# Patient Record
Sex: Male | Born: 1960 | Race: Black or African American | Hispanic: No | Marital: Married | Smoking: Never smoker
Health system: Southern US, Community
[De-identification: ages and names within clinical notes are randomized; demographics above are authoritative.]

## PROBLEM LIST (undated history)

## (undated) DIAGNOSIS — E119 Type 2 diabetes mellitus without complications: Secondary | ICD-10-CM

## (undated) DIAGNOSIS — I639 Cerebral infarction, unspecified: Secondary | ICD-10-CM

## (undated) DIAGNOSIS — E785 Hyperlipidemia, unspecified: Secondary | ICD-10-CM

## (undated) HISTORY — PX: BRAIN SURGERY: SHX531

---

## 2018-11-21 ENCOUNTER — Other Ambulatory Visit: Payer: Self-pay

## 2018-11-21 ENCOUNTER — Encounter (HOSPITAL_BASED_OUTPATIENT_CLINIC_OR_DEPARTMENT_OTHER): Payer: Self-pay | Admitting: *Deleted

## 2018-11-21 ENCOUNTER — Emergency Department (HOSPITAL_BASED_OUTPATIENT_CLINIC_OR_DEPARTMENT_OTHER): Payer: No Typology Code available for payment source

## 2018-11-21 ENCOUNTER — Emergency Department (HOSPITAL_BASED_OUTPATIENT_CLINIC_OR_DEPARTMENT_OTHER)
Admission: EM | Admit: 2018-11-21 | Discharge: 2018-11-21 | Disposition: A | Discharge status: Home / Self Care | Payer: No Typology Code available for payment source | Attending: Emergency Medicine | Admitting: Emergency Medicine

## 2018-11-21 DIAGNOSIS — Y929 Unspecified place or not applicable: Secondary | ICD-10-CM | POA: Diagnosis not present

## 2018-11-21 DIAGNOSIS — S098XXA Other specified injuries of head, initial encounter: Secondary | ICD-10-CM | POA: Insufficient documentation

## 2018-11-21 DIAGNOSIS — R112 Nausea with vomiting, unspecified: Secondary | ICD-10-CM | POA: Insufficient documentation

## 2018-11-21 DIAGNOSIS — Z87891 Personal history of nicotine dependence: Secondary | ICD-10-CM | POA: Diagnosis not present

## 2018-11-21 DIAGNOSIS — E119 Type 2 diabetes mellitus without complications: Secondary | ICD-10-CM | POA: Diagnosis not present

## 2018-11-21 DIAGNOSIS — Y9389 Activity, other specified: Secondary | ICD-10-CM | POA: Diagnosis not present

## 2018-11-21 DIAGNOSIS — Y999 Unspecified external cause status: Secondary | ICD-10-CM | POA: Insufficient documentation

## 2018-11-21 DIAGNOSIS — W01198A Fall on same level from slipping, tripping and stumbling with subsequent striking against other object, initial encounter: Secondary | ICD-10-CM | POA: Insufficient documentation

## 2018-11-21 DIAGNOSIS — S0990XA Unspecified injury of head, initial encounter: Secondary | ICD-10-CM

## 2018-11-21 HISTORY — DX: Hyperlipidemia, unspecified: E78.5

## 2018-11-21 HISTORY — DX: Cerebral infarction, unspecified: I63.9

## 2018-11-21 HISTORY — DX: Type 2 diabetes mellitus without complications: E11.9

## 2018-11-21 MED ORDER — ACETAMINOPHEN 500 MG PO TABS
1000.0000 mg | ORAL_TABLET | Freq: Once | ORAL | Status: AC
  Administered 2018-11-21: 16:00:00 1000 mg via ORAL
  Filled 2018-11-21: qty 2

## 2018-11-21 NOTE — ED Triage Notes (Signed)
C/o  head jury x 1 week ago , c/o h/a x 1 day , pt is on blood thinners

## 2018-11-21 NOTE — Discharge Instructions (Signed)
It was my pleasure taking care of you today!   Call your neurologist to schedule a follow up appointment.   Return to ER for new or worsening symptoms, any additional concerns.

## 2018-11-21 NOTE — ED Provider Notes (Signed)
[facility] EMERGENCY DEPARTMENT Provider Note   CSN: [id number] Arrival date & time: 11/21/18  1512     History   Chief Complaint Chief Complaint  Patient presents with  . Head Injury    HPI Lars MassonDallas Flavell is a 58 y.o. male.     The history is provided by the patient and medical records. No language interpreter was used.  Head Injury Associated symptoms: headache, nausea and vomiting    Lars MassonDallas Tumminello is a 58 y.o. male  with a PMH as listed below including previous stroke who presents to the Emergency Department complaining of headache.  Patient states that 1 week ago, he was getting out of his car when he lost his balance and fell backwards, striking the back of his head.  There was no loss of consciousness.  He did not think much of it at the time.  The next day, he developed headache consistent with his typical chronic migraine.  He did have 2 episodes of emesis associated with nausea during this.  He took Tylenol which helped improve his symptoms.  He felt well for the next several days.  This morning, he woke up with a right-sided headache once again.  Denies any new numbness or weakness. Currently feels much improved.  Denies vision changes or dizziness.   Past Medical History:  Diagnosis Date  . Diabetes mellitus without complication (HCC)   . Hyperlipemia   . Stroke [facility](HCC)     There are no active problems to display for this patient.   History reviewed. No pertinent surgical history.      Home Medications    Prior to Admission medications   Not on File    Family History No family history on file.  Social History Social History   Tobacco Use  . Smoking status: Former Smoker  Substance Use Topics  . Alcohol use: Not Currently  . Drug use: Not Currently     Allergies   Patient has no known allergies.   Review of Systems Review of Systems  Gastrointestinal: Positive for nausea and vomiting. Negative for abdominal pain, constipation and  diarrhea.  Neurological: Positive for headaches.  All other systems reviewed and are negative.    Physical Exam Updated Vital Signs BP 117/76   Pulse 66   Temp 99.2 F (37.3 C)   Resp 16   Ht 6' (1.829 m)   Wt 86.2 kg   SpO2 100%   BMI 25.77 kg/m   Physical Exam Vitals signs and nursing note reviewed.  Constitutional:      General: He is not in acute distress.    Appearance: He is well-developed.  HENT:     Head: Normocephalic and atraumatic.  Neck:     Musculoskeletal: Neck supple.  Cardiovascular:     Rate and Rhythm: Normal rate and regular rhythm.     Heart sounds: Normal heart sounds. No murmur.  Pulmonary:     Effort: Pulmonary effort is normal. No respiratory distress.     Breath sounds: Normal breath sounds.  Abdominal:     General: There is no distension.     Palpations: Abdomen is soft.     Tenderness: There is no abdominal tenderness.  Skin:    General: Skin is warm and dry.  Neurological:     Mental Status: He is alert and oriented to person, place, and time.     Comments: Left-sided upper and lower extremity weakness which is baseline for patient. CN 2-12 grossly intact.  ED Treatments / Results  Labs (all labs ordered are listed, but only abnormal results are displayed) Labs Reviewed - No data to display  EKG None  Radiology Ct Head Wo Contrast  Result Date: 11/21/2018 CLINICAL DATA:  Head injury 1 week ago. Generalized headache for 1 day. Patient is on blood thinners. History of stroke. EXAM: CT HEAD WITHOUT CONTRAST TECHNIQUE: Contiguous axial images were obtained from the base of the skull through the vertex without intravenous contrast. COMPARISON:  None. FINDINGS: Brain: Encephalomalacia noted throughout the entire right middle cerebral artery territory consistent with an extensive old MCA distribution infarct. There is ex vacuo dilation of the right lateral ventricle. Remaining ventricles are normal size and configuration. No  parenchymal or extra-axial masses. No mass effect. There is no evidence an infarct and no intracranial hemorrhage. Vascular: No hyperdense vessel or unexpected calcification. Skull: Changes from a previous right frontal, parietal, temporal craniotomy. No acute fracture. No bone lesion. Sinuses/Orbits: Globes and orbits are unremarkable. Visualized sinuses and mastoid air cells are clear. Other: None. IMPRESSION: 1. No acute intracranial abnormalities. 2. 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 Status post right-sided craniotomy. Electronically Signed   By: Lajean Manes M.D.   On: 11/21/2018 15:38    Procedures Procedures (including critical care time)  Medications Ordered in ED Medications  acetaminophen (TYLENOL) tablet 1,000 mg (1,000 mg Oral Given 11/21/18 1559)     Initial Impression / Assessment and Plan / ED Course  I have reviewed the triage vital signs and the nursing notes.  Pertinent labs & imaging results that were available during my care of the patient were reviewed by me and considered in my medical decision making (see chart for details).       Rozell Theiler is a 58 y.o. male who presents to ED for evaluation for head injury and headache after fall 1 week ago.  He is on anticoagulation.  Baseline left-sided deficits, but no acute neurology findings on exam.  CT without acute findings.  Tylenol has been controlling his headaches. Evaluation does not show pathology that would require ongoing emergent intervention or inpatient treatment.  He has a neurologist and I encouraged him to call today or tomorrow to schedule follow-up appointment.  Discussed reasons to return to the emergency department.  All questions answered.  Patient seen by and discussed with Dr. Ralene Bathe who agrees with treatment plan.    Final Clinical Impressions(s) / ED Diagnoses   Final diagnoses:  Minor head injury, initial  encounter    ED Discharge Orders    None       Panayiota Larkin, Ozella Almond, PA-C 11/21/18 1657    Quintella Reichert, MD 11/21/18 2357

## 2020-02-06 ENCOUNTER — Emergency Department (HOSPITAL_COMMUNITY): Payer: No Typology Code available for payment source

## 2020-02-06 ENCOUNTER — Encounter (HOSPITAL_COMMUNITY): Payer: Self-pay

## 2020-02-06 ENCOUNTER — Other Ambulatory Visit: Payer: Self-pay

## 2020-02-06 ENCOUNTER — Emergency Department (HOSPITAL_COMMUNITY)
Admission: EM | Admit: 2020-02-06 | Discharge: 2020-02-06 | Disposition: A | Discharge status: Home / Self Care | Payer: No Typology Code available for payment source | Attending: Emergency Medicine | Admitting: Emergency Medicine

## 2020-02-06 DIAGNOSIS — R2242 Localized swelling, mass and lump, left lower limb: Secondary | ICD-10-CM | POA: Diagnosis present

## 2020-02-06 DIAGNOSIS — M7989 Other specified soft tissue disorders: Secondary | ICD-10-CM | POA: Insufficient documentation

## 2020-02-06 DIAGNOSIS — Z7984 Long term (current) use of oral hypoglycemic drugs: Secondary | ICD-10-CM | POA: Insufficient documentation

## 2020-02-06 DIAGNOSIS — E119 Type 2 diabetes mellitus without complications: Secondary | ICD-10-CM | POA: Insufficient documentation

## 2020-02-06 DIAGNOSIS — Z7901 Long term (current) use of anticoagulants: Secondary | ICD-10-CM | POA: Diagnosis not present

## 2020-02-06 LAB — CBC WITH DIFFERENTIAL/PLATELET
Abs Immature Granulocytes: 0.02 10*3/uL (ref 0.00–0.07)
Basophils Absolute: 0 10*3/uL (ref 0.0–0.1)
Basophils Relative: 1 %
Eosinophils Absolute: 0.3 10*3/uL (ref 0.0–0.5)
Eosinophils Relative: 5 %
HCT: 48.6 % (ref 39.0–52.0)
Hemoglobin: 15.1 g/dL (ref 13.0–17.0)
Immature Granulocytes: 0 %
Lymphocytes Relative: 36 %
Lymphs Abs: 2.3 10*3/uL (ref 0.7–4.0)
MCH: 30 pg (ref 26.0–34.0)
MCHC: 31.1 g/dL (ref 30.0–36.0)
MCV: 96.4 fL (ref 80.0–100.0)
Monocytes Absolute: 0.6 10*3/uL (ref 0.1–1.0)
Monocytes Relative: 10 %
Neutro Abs: 3.1 10*3/uL (ref 1.7–7.7)
Neutrophils Relative %: 48 %
Platelets: 200 10*3/uL (ref 150–400)
RBC: 5.04 MIL/uL (ref 4.22–5.81)
RDW: 14.7 % (ref 11.5–15.5)
WBC: 6.4 10*3/uL (ref 4.0–10.5)
nRBC: 0 % (ref 0.0–0.2)

## 2020-02-06 LAB — COMPREHENSIVE METABOLIC PANEL
ALT: 26 U/L (ref 0–44)
AST: 24 U/L (ref 15–41)
Albumin: 4.3 g/dL (ref 3.5–5.0)
Alkaline Phosphatase: 96 U/L (ref 38–126)
Anion gap: 11 (ref 5–15)
BUN: 22 mg/dL — ABNORMAL HIGH (ref 6–20)
CO2: 18 mmol/L — ABNORMAL LOW (ref 22–32)
Calcium: 9.2 mg/dL (ref 8.9–10.3)
Chloride: 111 mmol/L (ref 98–111)
Creatinine, Ser: 1.33 mg/dL — ABNORMAL HIGH (ref 0.61–1.24)
GFR, Estimated: >60 mL/min (ref >60)
Glucose, Bld: 101 mg/dL — ABNORMAL HIGH (ref 70–99)
Potassium: 3.8 mmol/L (ref 3.5–5.1)
Sodium: 140 mmol/L (ref 135–145)
Total Bilirubin: 0.7 mg/dL (ref 0.3–1.2)
Total Protein: 9.3 g/dL — ABNORMAL HIGH (ref 6.5–8.1)

## 2020-02-06 MED ORDER — ENOXAPARIN SODIUM 100 MG/ML ~~LOC~~ SOLN
1.0000 mg/kg | Freq: Once | SUBCUTANEOUS | Status: AC
  Administered 2020-02-06: 95 mg via SUBCUTANEOUS
  Filled 2020-02-06: qty 0.95

## 2020-02-06 MED ORDER — IOHEXOL 350 MG/ML SOLN
100.0000 mL | Freq: Once | INTRAVENOUS | Status: AC | PRN
  Administered 2020-02-06: 100 mL via INTRAVENOUS

## 2020-02-06 NOTE — Discharge Instructions (Addendum)
Return tomorrow for an ultrasound for DVT.

## 2020-02-06 NOTE — ED Provider Notes (Signed)
[facility]-EMERGENCY DEPT Provider Note   CSN: [id number] Arrival date & time: 02/06/20  1304     History Chief Complaint  Patient presents with  . Leg Swelling  . Leg Pain    Eddie Conway is a 59 y.o. male.  Pt presents to the ED today with LLE swelling.  The pt has a hx of CVA with left sided deficits.  He has noticed some swelling for the past 1 to 1.5 weeks.  Pt has a hx of stroke and is on Xarelto.  He has not skipped any doses.  He has been having some sob as well.        Past Medical History:  Diagnosis Date  . Diabetes mellitus without complication (HCC)   . Hyperlipemia   . Stroke [facility])     There are no problems to display for this patient.   Past Surgical History:  Procedure Laterality Date  . BRAIN SURGERY         Family History  Problem Relation Age of Onset  . Diabetes Mother   . Diabetes Father   . Heart failure Father   . Hypertension Father     Social History   Tobacco Use  . Smoking status: Never Smoker  . Smokeless tobacco: Never Used  Vaping Use  . Vaping Use: Never used  Substance Use Topics  . Alcohol use: Not Currently  . Drug use: Not Currently    Home Medications Prior to Admission medications   Medication Sig Start Date End Date Taking? Authorizing Provider  acetaminophen (TYLENOL) 500 MG tablet Take 1,000 mg by mouth every 6 (six) hours as needed for moderate pain.   Yes [provider]  atorvastatin (LIPITOR) 40 MG tablet Take 40 mg by mouth daily.   Yes [provider]  cholecalciferol (VITAMIN D3) 25 MCG (1000 UNIT) tablet Take 1,000 Units by mouth daily.   Yes [provider]  ferrous sulfate 325 (65 FE) MG tablet Take 325 mg by mouth daily with breakfast.   Yes [provider]  levETIRAcetam (KEPPRA) 750 MG tablet Take 750 mg by mouth 2 (two) times daily.   Yes [provider]  magnesium oxide (MAG-OX) 400 MG tablet Take 400 mg by mouth daily.   Yes  [provider]  metFORMIN (GLUCOPHAGE) 500 MG tablet Take 500 mg by mouth 2 (two) times daily with a meal.   Yes [provider]  potassium chloride 20 MEQ/15ML (10%) SOLN Take 20 mEq by mouth See admin instructions. Take 15 ml by mouth 3 times a week ( Monday, Wednesday and Friday , must dilute as instructed)   Yes [provider]  rivaroxaban (XARELTO) 20 MG TABS tablet Take 20 mg by mouth daily with supper.   Yes [provider]  topiramate (TOPAMAX) 100 MG tablet Take 100 mg by mouth in the morning, at noon, and at bedtime.   Yes [provider]  vitamin B-12 (CYANOCOBALAMIN) 500 MCG tablet Take 1,000 mcg by mouth daily.   Yes [provider]    Allergies    Patient has no known allergies.  Review of Systems   Review of Systems  Respiratory: Positive for shortness of breath.   Musculoskeletal:       LLE swelling  All other systems reviewed and are negative.   Physical Exam Updated Vital Signs BP (!) 190/109   Pulse (!) 45   Temp 98.2 F (36.8 C) (Oral)   Resp 15  Ht 6' (1.829 m)   Wt 96.2 kg   SpO2 99%   BMI 28.75 kg/m   Physical Exam Vitals and nursing note reviewed.  Constitutional:      Appearance: Normal appearance.  HENT:     Head: Normocephalic and atraumatic.     Comments: Right sided craniotomy noted    Right Ear: External ear normal.     Left Ear: External ear normal.     Nose: Nose normal.     Mouth/Throat:     Mouth: Mucous membranes are moist.     Pharynx: Oropharynx is clear.  Eyes:     Extraocular Movements: Extraocular movements intact.     Conjunctiva/sclera: Conjunctivae normal.     Pupils: Pupils are equal, round, and reactive to light.  Cardiovascular:     Rate and Rhythm: Normal rate and regular rhythm.     Pulses: Normal pulses.     Heart sounds: Normal heart sounds.  Pulmonary:     Effort: Pulmonary effort is normal.     Breath sounds: Normal breath sounds.  Abdominal:      General: Abdomen is flat. Bowel sounds are normal.     Palpations: Abdomen is soft.  Musculoskeletal:     Cervical back: Normal range of motion and neck supple.     Left lower leg: Edema present.  Skin:    General: Skin is warm.     Capillary Refill: Capillary refill takes less than 2 seconds.  Neurological:     Mental Status: He is alert. Mental status is at baseline.     Comments: LLE and LUE weakness from prior stroke  Psychiatric:        Mood and Affect: Mood normal.        Behavior: Behavior normal.     ED Results / Procedures / Treatments   Labs (all labs ordered are listed, but only abnormal results are displayed) Labs Reviewed  COMPREHENSIVE METABOLIC PANEL - Abnormal; Notable for the following components:      Result Value   CO2 18 (*)    Glucose, Bld 101 (*)    BUN 22 (*)    Creatinine, Ser 1.33 (*)    Total Protein 9.3 (*)    All other components within normal limits  CBC WITH DIFFERENTIAL/PLATELET  URINALYSIS, ROUTINE W REFLEX MICROSCOPIC    EKG EKG Interpretation  Date/Time:  Friday February 06 2020 20:36:53 EST Ventricular Rate:  50 PR Interval:    QRS Duration: 81 QT Interval:  472 QTC Calculation: 431 R Axis:   160 Text Interpretation: Right and left arm electrode reversal, interpretation assumes no reversal Sinus rhythm Prolonged PR interval Lateral infarct, age indeterminate Abnrm T, consider ischemia, anterolateral lds No old tracing to compare Confirmed by Jacalyn Lefevre [phone]) on 02/06/2020 9:34:35 PM   Radiology CT Angio Chest PE W and/or Wo Contrast  Result Date: 02/06/2020 CLINICAL DATA:  Shortness of breath and leg swelling EXAM: CT ANGIOGRAPHY CHEST WITH CONTRAST TECHNIQUE: Multidetector CT imaging of the chest was performed using the standard protocol during bolus administration of intravenous contrast. Multiplanar CT image reconstructions and MIPs were obtained to evaluate the vascular anatomy. CONTRAST:  OMNIPAQUE IOHEXOL 350 MG/ML  SOLN COMPARISON:  None. FINDINGS: Cardiovascular: There is a optimal opacification of the pulmonary arteries. There is no central,segmental, or subsegmental filling defects within the pulmonary arteries. The heart is normal in size. No pericardial effusion or thickening. No evidence right heart strain. There is normal three-vessel brachiocephalic anatomy without proximal  stenosis. Scattered aortic atherosclerotic calcifications are seen without aneurysmal dilatation. Mediastinum/Nodes: No hilar, mediastinal, or axillary adenopathy. Thyroid gland, trachea, and esophagus demonstrate no significant findings. Lungs/Pleura: The lungs are clear. No pleural effusion or pneumothorax. No airspace consolidation. Upper Abdomen: No acute abnormalities present in the visualized portions of the upper abdomen. Musculoskeletal: No chest wall abnormality. No acute or significant osseous findings. Review of the MIP images confirms the above findings. IMPRESSION: No central, segmental, or subsegmental pulmonary embolism. No acute intrathoracic pathology to explain the patient's symptoms. Aortic Atherosclerosis (ICD10-I70.0). Electronically Signed   By: Jonna Clark M.D.   On: 02/06/2020 21:52    Procedures Procedures (including critical care time)  Medications Ordered in ED Medications  enoxaparin (LOVENOX) injection 95 mg (has no administration in time range)  iohexol (OMNIPAQUE) 350 MG/ML injection 100 mL (100 mLs Intravenous Contrast Given 02/06/20 2121)    ED Course  I have reviewed the triage vital signs and the nursing notes.  Pertinent labs & imaging results that were available during my care of the patient were reviewed by me and considered in my medical decision making (see chart for details).    MDM Rules/Calculators/A&P                          Fortunately, pt does not have a PE.  We don't have Vascular US now.  I gave him a dose of lovenox in the ED and referred him for an [place] to be done tomorrow  morning.   Pt's bp is elevated.  He said he does not have a hx of htn.  He follows frequently with his pcp and just saw him a few months ago.  He thinks it is elevated as he is here.  His Cr is slightly elevated.  He's never been told that before.  He is to f/u with his doctor for f/u with his bp and kidney function.  He is given a print out of labs as his pcp is at the [place].  Pt is stable for d/c.  Return if worse.   Final Clinical Impression(s) / ED Diagnoses Final diagnoses:  Localized swelling of left lower leg    Rx / DC Orders ED Discharge Orders         Ordered    LE VENOUS        02/06/20 2213           Jacalyn Lefevre, MD 02/06/20 2221

## 2020-02-06 NOTE — ED Triage Notes (Signed)
Per EMS- Patient c/o left leg swelling and pain x 1 1/2 weeks.  Patient has a history of CVA with left sided deficits.

## 2020-02-07 ENCOUNTER — Emergency Department (HOSPITAL_COMMUNITY)
Admission: EM | Admit: 2020-02-07 | Discharge: 2020-02-08 | Disposition: A | Discharge status: Home / Self Care | Payer: No Typology Code available for payment source | Attending: Emergency Medicine | Admitting: Emergency Medicine

## 2020-02-07 ENCOUNTER — Other Ambulatory Visit: Payer: Self-pay

## 2020-02-07 ENCOUNTER — Ambulatory Visit (HOSPITAL_COMMUNITY)
Admission: RE | Admit: 2020-02-07 | Discharge: 2020-02-07 | Disposition: A | Discharge status: Home / Self Care | Payer: Medicare PPO | Source: Ambulatory Visit | Attending: Emergency Medicine | Admitting: Emergency Medicine

## 2020-02-07 ENCOUNTER — Encounter (HOSPITAL_COMMUNITY): Payer: Self-pay

## 2020-02-07 DIAGNOSIS — Z8673 Personal history of transient ischemic attack (TIA), and cerebral infarction without residual deficits: Secondary | ICD-10-CM | POA: Insufficient documentation

## 2020-02-07 DIAGNOSIS — M79609 Pain in unspecified limb: Secondary | ICD-10-CM

## 2020-02-07 DIAGNOSIS — R2242 Localized swelling, mass and lump, left lower limb: Secondary | ICD-10-CM | POA: Diagnosis present

## 2020-02-07 DIAGNOSIS — M7989 Other specified soft tissue disorders: Secondary | ICD-10-CM | POA: Insufficient documentation

## 2020-02-07 DIAGNOSIS — E119 Type 2 diabetes mellitus without complications: Secondary | ICD-10-CM | POA: Diagnosis not present

## 2020-02-07 DIAGNOSIS — I82412 Acute embolism and thrombosis of left femoral vein: Secondary | ICD-10-CM | POA: Diagnosis not present

## 2020-02-07 DIAGNOSIS — Z7984 Long term (current) use of oral hypoglycemic drugs: Secondary | ICD-10-CM | POA: Diagnosis not present

## 2020-02-07 DIAGNOSIS — Z7901 Long term (current) use of anticoagulants: Secondary | ICD-10-CM | POA: Insufficient documentation

## 2020-02-07 LAB — BASIC METABOLIC PANEL
Anion gap: 10 (ref 5–15)
BUN: 23 mg/dL — ABNORMAL HIGH (ref 6–20)
CO2: 17 mmol/L — ABNORMAL LOW (ref 22–32)
Calcium: 8.8 mg/dL — ABNORMAL LOW (ref 8.9–10.3)
Chloride: 111 mmol/L (ref 98–111)
Creatinine, Ser: 1.42 mg/dL — ABNORMAL HIGH (ref 0.61–1.24)
GFR, Estimated: 57 mL/min — ABNORMAL LOW (ref >60)
Glucose, Bld: 106 mg/dL — ABNORMAL HIGH (ref 70–99)
Potassium: 3.7 mmol/L (ref 3.5–5.1)
Sodium: 138 mmol/L (ref 135–145)

## 2020-02-07 LAB — CBC
HCT: 44.8 % (ref 39.0–52.0)
Hemoglobin: 14 g/dL (ref 13.0–17.0)
MCH: 30 pg (ref 26.0–34.0)
MCHC: 31.3 g/dL (ref 30.0–36.0)
MCV: 95.9 fL (ref 80.0–100.0)
Platelets: 195 10*3/uL (ref 150–400)
RBC: 4.67 MIL/uL (ref 4.22–5.81)
RDW: 14.7 % (ref 11.5–15.5)
WBC: 6.7 10*3/uL (ref 4.0–10.5)
nRBC: 0 % (ref 0.0–0.2)

## 2020-02-07 NOTE — ED Triage Notes (Signed)
Patient sent from vascular lab following DVT LLE . Per tech it appears patient may have old clot to extremity, nothing new, patient alert and oriented, NAD

## 2020-02-07 NOTE — Progress Notes (Signed)
Left lower extremity venous duplex completed. Refer to "CV Proc" under chart review to view preliminary results.  Preliminary results discussed with Dr. Freida Busman and patient advised to return to ED for evaluation.  02/07/2020 11:39 AM Eula Fried., MHA, RVT, RDCS, RDMS for Jean Rosenthal, RDMS

## 2020-02-08 LAB — POC OCCULT BLOOD, ED: Fecal Occult Bld: NEGATIVE

## 2020-02-08 LAB — HEPARIN LEVEL (UNFRACTIONATED): Heparin Unfractionated: <0.1 IU/mL — ABNORMAL LOW (ref 0.30–0.70)

## 2020-02-08 MED ORDER — ENOXAPARIN SODIUM 30 MG/0.3ML ~~LOC~~ SOLN: 1.0000 mg/kg | Freq: Two times a day (BID) | SUBCUTANEOUS | 0 refills | Status: DC

## 2020-02-08 MED ORDER — ENOXAPARIN SODIUM 100 MG/ML ~~LOC~~ SOLN
100.0000 mg | Freq: Once | SUBCUTANEOUS | Status: AC
  Administered 2020-02-08: 100 mg via SUBCUTANEOUS
  Filled 2020-02-08: qty 1

## 2020-02-08 NOTE — Discharge Instructions (Signed)
Start the lovenox-- use as directed.  I have sent this to [facility] pharmacy for you. It is very important that you follow-up with your primary care doctor as soon as possible for ongoing management of new clot. Return here for any new/acute changes-- irregular bleeding, chest pain, trouble breathing, etc.

## 2020-02-08 NOTE — ED Provider Notes (Signed)
[facility] EMERGENCY DEPARTMENT Provider Note   CSN: [id number] Arrival date & time: 02/07/20  1150     History Chief Complaint  Patient presents with   Leg Swelling    Eddie Conway is a 59 y.o. male.  The history is provided by the patient and medical records.    59 y.o. M with hx of DM, HLP, prior stroke with residual left sided weakness, presenting to the ED from vascular center with + [place] results.  States for the past week and half he has had increased pain and swelling in left calf, ankle, and foot.  He denies new injury, trauma, or fall.  No fever/chills.  Denies chest pain or SOB.  He was seen in ED last evening and had CTA that was negative for PE but had to come back today for [place].  He denies chest pain or SOB.  He has been compliant with his xarelto.  Was started on anticoagulation in 2009 following stroke for DVT in left ankle.  States initially was on coumadin, then switched to xarelto to eliminate OP monitoring.  States he has noticed some blood in his stool and urine recently.  States small amounts here and there but has never noticed it before now.  Denies bloody vomit.  No bleeding gums.  Past Medical History:  Diagnosis Date   Diabetes mellitus without complication (HCC)    Hyperlipemia    Stroke (HCC)     There are no problems to display for this patient.   Past Surgical History:  Procedure Laterality Date   BRAIN SURGERY         Family History  Problem Relation Age of Onset   Diabetes Mother    Diabetes Father    Heart failure Father    Hypertension Father     Social History   Tobacco Use   Smoking status: Never Smoker   Smokeless tobacco: Never Used  Vaping Use   Vaping Use: Never used  Substance Use Topics   Alcohol use: Not Currently   Drug use: Not Currently    Home Medications Prior to Admission medications   Medication Sig Start Date End Date Taking? Authorizing Provider  acetaminophen (TYLENOL) 500  MG tablet Take 1,000 mg by mouth every 6 (six) hours as needed for moderate pain.    [provider]  atorvastatin (LIPITOR) 40 MG tablet Take 40 mg by mouth daily.    [provider]  cholecalciferol (VITAMIN D3) 25 MCG (1000 UNIT) tablet Take 1,000 Units by mouth daily.    [provider]  ferrous sulfate 325 (65 FE) MG tablet Take 325 mg by mouth daily with breakfast.    [provider]  levETIRAcetam (KEPPRA) 750 MG tablet Take 750 mg by mouth 2 (two) times daily.    [provider]  magnesium oxide (MAG-OX) 400 MG tablet Take 400 mg by mouth daily.    [provider]  metFORMIN (GLUCOPHAGE) 500 MG tablet Take 500 mg by mouth 2 (two) times daily with a meal.    [provider]  potassium chloride 20 MEQ/15ML (10%) SOLN Take 20 mEq by mouth See admin instructions. Take 15 ml by mouth 3 times a week ( Monday, Wednesday and Friday , must dilute as instructed)    [provider]  rivaroxaban (XARELTO) 20 MG TABS tablet Take 20 mg by mouth daily with supper.    [provider]  topiramate (TOPAMAX) 100 MG tablet Take 100 mg by mouth  in the morning, at noon, and at bedtime.    [provider]  vitamin B-12 (CYANOCOBALAMIN) 500 MCG tablet Take 1,000 mcg by mouth daily.    [provider]    Allergies    Patient has no known allergies.  Review of Systems   Review of Systems  Cardiovascular: Positive for leg swelling.  All other systems reviewed and are negative.   Physical Exam Updated Vital Signs BP 134/88 (BP Location: Right Arm)    Pulse (!) 53    Temp 98.4 F (36.9 C) (Oral)    Resp 20    SpO2 97%   Physical Exam Vitals and nursing note reviewed.  Constitutional:      Appearance: He is well-developed.  HENT:     Head: Normocephalic and atraumatic.  Eyes:     Conjunctiva/sclera: Conjunctivae normal.     Pupils: Pupils are equal, round, and reactive to light.  Cardiovascular:     Rate  and Rhythm: Normal rate and regular rhythm.     Heart sounds: Normal heart sounds.  Pulmonary:     Effort: Pulmonary effort is normal. No respiratory distress.     Breath sounds: Normal breath sounds. No rhonchi.  Abdominal:     General: Bowel sounds are normal.     Palpations: Abdomen is soft.     Tenderness: There is no abdominal tenderness. There is no rebound.  Genitourinary:    Comments: Exam chaperoned by NT Normal rectum, brown stool noted on DRE, no gross blood, hemoccult negative Musculoskeletal:        General: Normal range of motion.     Cervical back: Normal range of motion.     Comments: Leg brace left leg Mild swelling of left lower leg/ankle compared with right, no overlying erythema, induration, or other superficial skin changes, no wounds/sores, DP pulse intact  Skin:    General: Skin is warm and dry.  Neurological:     Mental Status: He is alert and oriented to person, place, and time.     Comments: Noted left sided weakness, baseline from prior CVA     ED Results / Procedures / Treatments   Labs (all labs ordered are listed, but only abnormal results are displayed) Labs Reviewed  BASIC METABOLIC PANEL - Abnormal; Notable for the following components:      Result Value   CO2 17 (*)    Glucose, Bld 106 (*)    BUN 23 (*)    Creatinine, Ser 1.42 (*)    Calcium 8.8 (*)    GFR, Estimated 57 (*)    All other components within normal limits  HEPARIN LEVEL (UNFRACTIONATED) - Abnormal; Notable for the following components:   Heparin Unfractionated <0.10 (*)    All other components within normal limits  CBC  URINALYSIS, ROUTINE W REFLEX MICROSCOPIC  POC OCCULT BLOOD, ED    EKG None  Radiology CT Angio Chest PE W and/or Wo Contrast  Result Date: 02/06/2020 CLINICAL DATA:  Shortness of breath and leg swelling EXAM: CT ANGIOGRAPHY CHEST WITH CONTRAST TECHNIQUE: Multidetector CT imaging of the chest was performed using the standard protocol during bolus  administration of intravenous contrast. Multiplanar CT image reconstructions and MIPs were obtained to evaluate the vascular anatomy. CONTRAST:  OMNIPAQUE IOHEXOL 350 MG/ML SOLN COMPARISON:  None. FINDINGS: Cardiovascular: There is a optimal opacification of the pulmonary arteries. There is no central,segmental, or subsegmental filling defects within the pulmonary arteries. The heart is normal in size. No pericardial effusion or  thickening. No evidence right heart strain. There is normal three-vessel brachiocephalic anatomy without proximal stenosis. Scattered aortic atherosclerotic calcifications are seen without aneurysmal dilatation. Mediastinum/Nodes: No hilar, mediastinal, or axillary adenopathy. Thyroid gland, trachea, and esophagus demonstrate no significant findings. Lungs/Pleura: The lungs are clear. No pleural effusion or pneumothorax. No airspace consolidation. Upper Abdomen: No acute abnormalities present in the visualized portions of the upper abdomen. Musculoskeletal: No chest wall abnormality. No acute or significant osseous findings. Review of the MIP images confirms the above findings. IMPRESSION: No central, segmental, or subsegmental pulmonary embolism. No acute intrathoracic pathology to explain the patient's symptoms. Aortic Atherosclerosis (ICD10-I70.0). Electronically Signed   By: Jonna Clark M.D.   On: 02/06/2020 21:52   LE VENOUS  Result Date: 02/07/2020  Lower Venous DVT Study Indications: Swelling, and Pain. Other Indications: History of stroke. Anticoagulation: Xarelto. Comparison Study: No prior study Performing Technologist: Jean Rosenthal, RDMS  Examination Guidelines: A complete evaluation includes B-mode imaging, spectral Doppler, color Doppler, and power Doppler as needed of all accessible portions of each vessel. Bilateral testing is considered an integral part of a complete examination. Limited examinations for reoccurring indications may be performed as noted. The reflux  portion of the exam is performed with the patient in reverse Trendelenburg.  +-----+---------------+---------+-----------+----------+--------------+  RIGHT Compressibility Phasicity Spontaneity Properties Thrombus Aging  +-----+---------------+---------+-----------+----------+--------------+  CFV   Full            Yes       Yes                                    +-----+---------------+---------+-----------+----------+--------------+   +---------+---------------+---------+-----------+----------+-----------------+  LEFT      Compressibility Phasicity Spontaneity Properties Thrombus Aging     +---------+---------------+---------+-----------+----------+-----------------+  CFV       Full            Yes       Yes                                       +---------+---------------+---------+-----------+----------+-----------------+  SFJ       Full                                                                +---------+---------------+---------+-----------+----------+-----------------+  FV Prox   Partial         Yes       Yes                    Age Indeterminate  +---------+---------------+---------+-----------+----------+-----------------+  FV Mid    Full                                                                +---------+---------------+---------+-----------+----------+-----------------+  FV Distal Full                                                                +---------+---------------+---------+-----------+----------+-----------------+  PFV       Full                                                                +---------+---------------+---------+-----------+----------+-----------------+  POP       Full            Yes       Yes                                       +---------+---------------+---------+-----------+----------+-----------------+  PTV       Full                                                                +---------+---------------+---------+-----------+----------+-----------------+   Left  Technical Findings: Not visualized segments include peroneal veins.   Summary: RIGHT: - No evidence of common femoral vein obstruction.  LEFT: - Findings consistent with age indeterminate deep vein thrombosis involving the proximal left femoral vein. - No cystic structure found in the popliteal fossa.  *See table(s) above for measurements and observations. Electronically signed by Coral ElseVance Brabham MD on 02/07/2020 at 5:32:40 PM.    Final     Procedures Procedures (including critical care time)  Medications Ordered in ED Medications  enoxaparin (LOVENOX) injection 100 mg (100 mg Subcutaneous Given 02/08/20 0437)    ED Course  I have reviewed the triage vital signs and the nursing notes.  Pertinent labs & imaging results that were available during my care of the patient were reviewed by me and considered in my medical decision making (see chart for details).    MDM Rules/Calculators/A&P  59 year old male presenting to the ED from vascular ultrasound due to positive result.  States he has had swelling and pain in left lower leg over the past week to week and a half.  Seen in ED yesterday and had negative PE study but unable to obtain ultrasound.  Today this was positive for age-indeterminate clot in left leg.  Given new recent symptoms, suspect this is acute.  He is chronically on Xarelto for remote history of clot and left leg in 2009 (on coumadin initially, transitioned to xarelto a few months later).  States no issues with the Xarelto, does report some blood in the stool and urine last week.  He has never had issues like this before.  Hemoccult was performed, no gross blood and occult card is negative.  No significant anemia noted on blood work.  Patient has no hx of prior clot on xarelto and is adamant he has been compliant with this.  1:59 AM Discussed with pharmacist, Fayrene FearingJames, regarding anticoagulation options given new clot while on xarelto.  Recommended anti Xa level for now.  If elevated as  expected if compliant with meds, may need to bridge with lovenox back to coumadin.  4:01 AM Xa level undetectable.  Pharmacy recommends to start lovenox 1mg /kg Q12H for the next week w/xarelto, may need to be switched back to coumadin but will make sure  he has PCP follow-up prior to transitioning.  Discussed with patient, he is comfortable with this.  Will give dose of lovenox now.  He understands importance of close follow-up within the next few days.  Will return here for any new/acute changes.  Final Clinical Impression(s) / ED Diagnoses Final diagnoses:  Acute deep vein thrombosis (DVT) of femoral vein of left lower extremity (HCC)    Rx / DC Orders ED Discharge Orders         Ordered    enoxaparin (LOVENOX) 30 MG/0.3ML injection  Every 12 hours        02/08/20 0419           Garlon Hatchet, PA-C 02/08/20 0445    Nira Conn, MD 02/08/20 [phone]

## 2020-02-26 ENCOUNTER — Encounter (HOSPITAL_COMMUNITY): Payer: Self-pay

## 2020-02-26 ENCOUNTER — Emergency Department (HOSPITAL_COMMUNITY)
Admission: EM | Admit: 2020-02-26 | Discharge: 2020-02-26 | Disposition: A | Discharge status: Home / Self Care | Payer: No Typology Code available for payment source | Attending: Emergency Medicine | Admitting: Emergency Medicine

## 2020-02-26 ENCOUNTER — Other Ambulatory Visit: Payer: Self-pay

## 2020-02-26 ENCOUNTER — Emergency Department (HOSPITAL_COMMUNITY): Payer: No Typology Code available for payment source

## 2020-02-26 DIAGNOSIS — R519 Headache, unspecified: Secondary | ICD-10-CM | POA: Diagnosis not present

## 2020-02-26 DIAGNOSIS — Z7901 Long term (current) use of anticoagulants: Secondary | ICD-10-CM | POA: Insufficient documentation

## 2020-02-26 DIAGNOSIS — E119 Type 2 diabetes mellitus without complications: Secondary | ICD-10-CM | POA: Diagnosis not present

## 2020-02-26 DIAGNOSIS — Z7984 Long term (current) use of oral hypoglycemic drugs: Secondary | ICD-10-CM | POA: Diagnosis not present

## 2020-02-26 DIAGNOSIS — Z8673 Personal history of transient ischemic attack (TIA), and cerebral infarction without residual deficits: Secondary | ICD-10-CM | POA: Diagnosis not present

## 2020-02-26 DIAGNOSIS — H538 Other visual disturbances: Secondary | ICD-10-CM | POA: Insufficient documentation

## 2020-02-26 DIAGNOSIS — Z86718 Personal history of other venous thrombosis and embolism: Secondary | ICD-10-CM | POA: Insufficient documentation

## 2020-02-26 DIAGNOSIS — Z79899 Other long term (current) drug therapy: Secondary | ICD-10-CM | POA: Diagnosis not present

## 2020-02-26 DIAGNOSIS — E872 Acidosis: Secondary | ICD-10-CM | POA: Diagnosis not present

## 2020-02-26 DIAGNOSIS — I10 Essential (primary) hypertension: Secondary | ICD-10-CM | POA: Diagnosis not present

## 2020-02-26 DIAGNOSIS — E876 Hypokalemia: Secondary | ICD-10-CM | POA: Diagnosis not present

## 2020-02-26 LAB — COMPREHENSIVE METABOLIC PANEL
ALT: 31 U/L (ref 0–44)
AST: 28 U/L (ref 15–41)
Albumin: 3.5 g/dL (ref 3.5–5.0)
Alkaline Phosphatase: 84 U/L (ref 38–126)
Anion gap: 10 (ref 5–15)
BUN: 14 mg/dL (ref 6–20)
CO2: 20 mmol/L — ABNORMAL LOW (ref 22–32)
Calcium: 9.2 mg/dL (ref 8.9–10.3)
Chloride: 111 mmol/L (ref 98–111)
Creatinine, Ser: 1.41 mg/dL — ABNORMAL HIGH (ref 0.61–1.24)
GFR, Estimated: 57 mL/min — ABNORMAL LOW (ref >60)
Glucose, Bld: 92 mg/dL (ref 70–99)
Potassium: 3.4 mmol/L — ABNORMAL LOW (ref 3.5–5.1)
Sodium: 141 mmol/L (ref 135–145)
Total Bilirubin: 0.7 mg/dL (ref 0.3–1.2)
Total Protein: 7.8 g/dL (ref 6.5–8.1)

## 2020-02-26 LAB — CBC
HCT: 45 % (ref 39.0–52.0)
Hemoglobin: 14.2 g/dL (ref 13.0–17.0)
MCH: 29.6 pg (ref 26.0–34.0)
MCHC: 31.6 g/dL (ref 30.0–36.0)
MCV: 93.8 fL (ref 80.0–100.0)
Platelets: 204 10*3/uL (ref 150–400)
RBC: 4.8 MIL/uL (ref 4.22–5.81)
RDW: 14.3 % (ref 11.5–15.5)
WBC: 7.5 10*3/uL (ref 4.0–10.5)
nRBC: 0 % (ref 0.0–0.2)

## 2020-02-26 LAB — PROTIME-INR
INR: 1.3 — ABNORMAL HIGH (ref 0.8–1.2)
Prothrombin Time: 15.9 seconds — ABNORMAL HIGH (ref 11.4–15.2)

## 2020-02-26 LAB — APTT: aPTT: 29 seconds (ref 24–36)

## 2020-02-26 LAB — DIFFERENTIAL
Abs Immature Granulocytes: 0.04 10*3/uL (ref 0.00–0.07)
Basophils Absolute: 0 10*3/uL (ref 0.0–0.1)
Basophils Relative: 1 %
Eosinophils Absolute: 0.3 10*3/uL (ref 0.0–0.5)
Eosinophils Relative: 4 %
Immature Granulocytes: 1 %
Lymphocytes Relative: 30 %
Lymphs Abs: 2.2 10*3/uL (ref 0.7–4.0)
Monocytes Absolute: 0.9 10*3/uL (ref 0.1–1.0)
Monocytes Relative: 12 %
Neutro Abs: 4 10*3/uL (ref 1.7–7.7)
Neutrophils Relative %: 52 %

## 2020-02-26 MED ORDER — SODIUM CHLORIDE 0.9% FLUSH: 3.0000 mL | Freq: Once | INTRAVENOUS | Status: DC

## 2020-02-26 MED ORDER — APIXABAN 5 MG PO TABS: 5.0000 mg | ORAL_TABLET | Freq: Two times a day (BID) | ORAL | 0 refills | Status: DC

## 2020-02-26 NOTE — Discharge Instructions (Addendum)
Seen here for a headache, lab work and imaging all looks reassuring.  I have changed your anticoags from Xarelto to Eliquis.  I recommend stop taking your Xarelto and start taking  Eliquis.  If you cannot pick up your medications tonight I would take your Xarelto as prescribed tonight and then starting Eliquis tomorrow when you pick up as prescribed.  Please follow-up with your primary care provider for further management.  Come back to the emergency department if you develop chest pain, shortness of breath, severe abdominal pain, uncontrolled nausea, vomiting, diarrhea.

## 2020-02-26 NOTE — ED Provider Notes (Signed)
[facility] EMERGENCY DEPARTMENT Provider Note   CSN: [id number] Arrival date & time: 02/26/20  1250     History Chief Complaint  Patient presents with  . Headache    Eddie Conway is a 59 y.o. male.  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  Patient states he was recently diagnosed with a DVT in his left leg 12/04 while on Xarelto.  last provider consulted with pharmacy who placed patient on 14 days worth of Lovenox and  instructed patient to switch back to Xarelto at a higher dose.  He states he started this new dose of Xarelto on Monday and has had severe headaches that he feels all around his head and has associated blurry vision, photophobia and sensitivity to noise.  He denies paresthesias or worsening weakness in his upper or lower extremities.  He denies recent falls or head trauma.  He states he has a history of migraines since his stroke back in 2009 but he states these headaches are worse than what he had back then.  He states the blurry vision only occurs when he has these headaches after taking the Xarelto. He was seen at his primary care provider today who was concerned that he may have a head bleed and sent him here for further observation.  He has withheld his Xarelto and states that he does not have much of a headache currently.  He denies any other symptoms at this time.  He denies fevers, chills, nasal congestion sore throat, cough, chest pain, abdominal pain, nausea, vomiting, diarrhea, pedal edema.  Past Medical History:  Diagnosis Date  . Diabetes mellitus without complication (HCC)   . Hyperlipemia   . Stroke [facility])     There are no problems to display for this patient.   Past Surgical History:  Procedure Laterality Date  . BRAIN  SURGERY         Family History  Problem Relation Age of Onset  . Diabetes Mother   . Diabetes Father   . Heart failure Father   . Hypertension Father     Social History   Tobacco Use  . Smoking status: Never Smoker  . Smokeless tobacco: Never Used  Vaping Use  . Vaping Use: Never used  Substance Use Topics  . Alcohol use: Not Currently  . Drug use: Not Currently    Home Medications Prior to Admission medications   Medication Sig Start Date End Date Taking? Authorizing Provider  rivaroxaban (XARELTO) 20 MG TABS tablet Take 20 mg by mouth daily with supper.   Yes [provider]  acetaminophen (TYLENOL) 500 MG tablet Take 1,000 mg by mouth every 6 (six) hours as needed for moderate pain.    [provider]  atorvastatin (LIPITOR) 40 MG tablet Take 40 mg by mouth daily.    [provider]  cholecalciferol (VITAMIN D3) 25 MCG (1000 UNIT) tablet Take 1,000 Units by mouth daily.    [provider]  enoxaparin (LOVENOX) 30 MG/0.3ML injection Inject 0.95 mLs (95 mg total) into the skin every 12 (twelve) hours. 02/08/20   Garlon Hatchet, PA-C  ferrous sulfate 325 (65 FE) MG tablet Take 325 mg by mouth daily with breakfast.    [provider]  levETIRAcetam (KEPPRA) 750 MG tablet Take 750 mg by mouth 2 (two) times daily.  [provider]  magnesium oxide (MAG-OX) 400 MG tablet Take 400 mg by mouth daily.    [provider]  metFORMIN (GLUCOPHAGE) 500 MG tablet Take 500 mg by mouth 2 (two) times daily with a meal.    [provider]  potassium chloride 20 MEQ/15ML (10%) SOLN Take 20 mEq by mouth See admin instructions. Take 15 ml by mouth 3 times a week ( Monday, Wednesday and Friday , must dilute as instructed)    [provider]  topiramate (TOPAMAX) 100 MG tablet Take 100 mg by mouth in the morning, at noon, and at bedtime.    [provider]  vitamin B-12 (CYANOCOBALAMIN) 500 MCG tablet Take  1,000 mcg by mouth daily.    [provider]    Allergies    Patient has no known allergies.  Review of Systems   Review of Systems  Constitutional: Negative for chills and fever.  HENT: Negative for congestion and tinnitus.   Eyes: Positive for visual disturbance.  Respiratory: Negative for cough and shortness of breath.   Cardiovascular: Negative for chest pain.  Gastrointestinal: Negative for abdominal pain, diarrhea, nausea and vomiting.  Genitourinary: Negative for enuresis.  Musculoskeletal: Negative for back pain.  Skin: Negative for rash.  Neurological: Positive for headaches. Negative for dizziness, weakness and numbness.  Hematological: Does not bruise/bleed easily.    Physical Exam Updated Vital Signs BP (!) 141/92   Pulse 61   Temp 98 F (36.7 C) (Oral)   Resp 14   Ht 6' (1.829 m)   Wt 96.2 kg   SpO2 100%   BMI 28.75 kg/m   Physical Exam Vitals and nursing note reviewed.  Constitutional:      General: He is not in acute distress.    Appearance: Normal appearance. He is not ill-appearing or diaphoretic.  HENT:     Head: Normocephalic and atraumatic.     Right Ear: Tympanic membrane, ear canal and external ear normal.     Left Ear: Tympanic membrane, ear canal and external ear normal.     Nose: Nose normal. No congestion or rhinorrhea.     Mouth/Throat:     Mouth: Mucous membranes are moist.     Pharynx: Oropharynx is clear. No oropharyngeal exudate or posterior oropharyngeal erythema.  Eyes:     General: No visual field deficit or scleral icterus.    Extraocular Movements: Extraocular movements intact.     Conjunctiva/sclera: Conjunctivae normal.     Pupils: Pupils are equal, round, and reactive to light.  Cardiovascular:     Rate and Rhythm: Normal rate and regular rhythm.     Pulses: Normal pulses.     Heart sounds: No murmur heard. No friction rub. No gallop.   Pulmonary:     Effort: Pulmonary effort is normal. No respiratory distress.      Breath sounds: No wheezing, rhonchi or rales.  Abdominal:     General: There is no distension.     Palpations: Abdomen is soft.     Tenderness: There is no abdominal tenderness. There is no guarding.  Musculoskeletal:        General: No swelling or tenderness.     Cervical back: Normal range of motion. No tenderness.  Skin:    General: Skin is warm and dry.  Neurological:     Mental Status: He is alert. Mental status is at baseline.     GCS: GCS eye subscore is 4. GCS verbal subscore is 5. GCS motor subscore  is 6.     Cranial Nerves: Facial asymmetry present. No cranial nerve deficit.     Sensory: Sensation is intact. No sensory deficit.     Motor: Weakness present.     Comments: Patient has noted left-sided weakness from prior stroke, continue has left-sided facial droop, unable to move his left upper extremity, has limited movement with his left lower extremity, unable to wiggle toes, unable to bend knee, able to flex at the hip.  Psychiatric:        Mood and Affect: Mood normal.     ED Results / Procedures / Treatments   Labs (all labs ordered are listed, but only abnormal results are displayed) Labs Reviewed  PROTIME-INR - Abnormal; Notable for the following components:      Result Value   Prothrombin Time 15.9 (*)    INR 1.3 (*)    All other components within normal limits  COMPREHENSIVE METABOLIC PANEL - Abnormal; Notable for the following components:   Potassium 3.4 (*)    CO2 20 (*)    Creatinine, Ser 1.41 (*)    GFR, Estimated 57 (*)    All other components within normal limits  APTT  CBC  DIFFERENTIAL    EKG EKG Interpretation  Date/Time:  Thursday February 26 2020 12:58:20 EST Ventricular Rate:  76 PR Interval:  182 QRS Duration: 78 QT Interval:  410 QTC Calculation: 461 R Axis:   39 Text Interpretation: Sinus rhythm with Premature atrial complexes with Abberant conduction ST & T wave abnormality, consider inferolateral ischemia Prolonged QT  Abnormal ECG No significant change since last tracing Confirmed by Jacalyn Lefevre [id number]) on 02/26/2020 3:26:23 PM   Radiology CT HEAD WO CONTRAST  Result Date: 02/26/2020 CLINICAL DATA:  Headache EXAM: CT HEAD WITHOUT CONTRAST TECHNIQUE: Contiguous axial images were obtained from the base of the skull through the vertex without intravenous contrast. COMPARISON:  11/21/2018 FINDINGS: Brain: Large area encephalomalacia from prior right MCA infarct again noted and stable. No signs of acute infarct or hemorrhage. Lateral ventricles and midline structures are stable. No acute extra-axial fluid collections. No mass effect. Vascular: No hyperdense vessel or unexpected calcification. Skull: Postsurgical changes from right-sided craniotomy. No acute displaced fracture. Sinuses/Orbits: No acute finding. Other: None. IMPRESSION: 1. No acute intracranial process. 2. Chronic encephalomalacia from previous right MCA infarct, with stable postsurgical changes from previous right-sided craniotomy. Electronically Signed   By: Sharlet Salina M.D.   On: 02/26/2020 15:38    Procedures Procedures (including critical care time)  Medications Ordered in ED Medications  sodium chloride flush (NS) 0.9 % injection 3 mL (has no administration in time range)    ED Course  I have reviewed the triage vital signs and the nursing notes.  Pertinent labs & imaging results that were available during my care of the patient were reviewed by me and considered in my medical decision making (see chart for details).    MDM Rules/Calculators/A&P                          Patient presents with increasing headaches and blurry vision.  He is alert, does not appear in acute distress, vital signs reassuring.  Will obtain basic lab work-up, send down for head CT for further evaluation.  Due to patient's previous stroke in 2009 and new onset of headaches with blurry vision will consult with neurology for further recommendations. Spoke  with Dr.Absher and they feel that it is unlikely patient suffering  from an acute stroke and does not warrant further imaging or work-up.  It is possible he may be suffering from headaches associated with the increased dosage of anticoags.  Recommend possibly changing to another anticoag.  Spoke with pharmacist Romeo AppleBen who recommends changing patient from Xarelto to Eliquis, does not need to load patient as he is currently been taking his Xarelto.  CBC negative for leukocytosis or signs of anemia.  CMP shows slight hypokalemia 3.4, slight metabolic acidosis with with a CO2 of 20, creatinine at baseline, no elevated liver enzymes, no anion gap present.  Prothrombin time 15.9, INR 1.3 CT head does not reveal any acute findings.  EKG sinus rhythm without signs of ischemia no ST elevation depression noted.  There is noted slightly elongated QT 467.  low suspicion for CVA or intracranial head bleed as patient denies change in vision, paresthesias or weakness to upper lower extremities, no neuro exam is at baseline, CT head did not reveal any acute findings.  Low suspicion for ACS or arrhythmias as patient denies chest pain, shortness of breath, no hypoperfusion or fluid overload on exam, EKG sinus without signs of ischemia.  Low suspicion for systemic infection as patient is nontoxic-appearing, vital signs reassuring, no obvious source infection noted on exam.  Low suspicion for otitis externa or otitis media as there is no signs infection on my exam.  Low suspicion for dental abnormality as patient denies any dental pain at this time.  Suspect patient's headaches may be secondary to medication will change to Eliquis and recommend he follows up with his PCP for further management.  Vital signs have remained stable, no indication for hospital admission.  Patient discussed with attending and they agreed with assessment and plan.  Patient given at home care as well strict return precautions.  Patient verbalized that they  understood agreed to said plan.   Final Clinical Impression(s) / ED Diagnoses Final diagnoses:  None    Rx / DC Orders ED Discharge Orders    None       Carroll SageFaulkner, Geofrey Silliman J, PA-C 02/26/20 1839    Rozelle LoganHorton, Kristie M, DO 02/27/20 [phone]

## 2020-02-26 NOTE — ED Triage Notes (Signed)
Pt sent here by [facility] doctor to rule out a stroke. Pt started on xarelto on Monday d/t DVT and has been having headaches since Tuesday with bilateral blurred vision. Pt has hx of stroke in 2009 with left sided deficits. Pt a.o, nad noted

## 2020-05-07 ENCOUNTER — Emergency Department (HOSPITAL_COMMUNITY)
Admission: EM | Admit: 2020-05-07 | Discharge: 2020-05-07 | Disposition: A | Discharge status: Home / Self Care | Payer: No Typology Code available for payment source | Attending: Emergency Medicine | Admitting: Emergency Medicine

## 2020-05-07 ENCOUNTER — Encounter (HOSPITAL_COMMUNITY): Payer: Self-pay | Admitting: *Deleted

## 2020-05-07 ENCOUNTER — Emergency Department (HOSPITAL_BASED_OUTPATIENT_CLINIC_OR_DEPARTMENT_OTHER)
Admit: 2020-05-07 | Discharge: 2020-05-07 | Disposition: A | Discharge status: Home / Self Care | Payer: No Typology Code available for payment source | Attending: Emergency Medicine | Admitting: Emergency Medicine

## 2020-05-07 ENCOUNTER — Other Ambulatory Visit: Payer: Self-pay

## 2020-05-07 DIAGNOSIS — E119 Type 2 diabetes mellitus without complications: Secondary | ICD-10-CM | POA: Diagnosis not present

## 2020-05-07 DIAGNOSIS — M7989 Other specified soft tissue disorders: Secondary | ICD-10-CM | POA: Diagnosis not present

## 2020-05-07 DIAGNOSIS — I10 Essential (primary) hypertension: Secondary | ICD-10-CM | POA: Insufficient documentation

## 2020-05-07 DIAGNOSIS — M79605 Pain in left leg: Secondary | ICD-10-CM

## 2020-05-07 DIAGNOSIS — R2242 Localized swelling, mass and lump, left lower limb: Secondary | ICD-10-CM | POA: Diagnosis not present

## 2020-05-07 DIAGNOSIS — Z7984 Long term (current) use of oral hypoglycemic drugs: Secondary | ICD-10-CM | POA: Insufficient documentation

## 2020-05-07 NOTE — ED Notes (Signed)
Pt states that she pain is decreased when standing with ace wrap, pain free at rest

## 2020-05-07 NOTE — ED Provider Notes (Signed)
[facility] EMERGENCY DEPARTMENT Provider Note   CSN: [id number] Arrival date & time: 05/07/20  1130     History Chief Complaint  Patient presents with  . Leg Pain    Pj Zehner is a 60 y.o. male.  HPI   Patient with significant medical history of diabetes, hypertension, CVA with left sided weakness 2009, DVT currently on Eliquis.  Patient endorses  worsening left leg swelling and pain since Sunday.  He endorses it started while he was at church, he states the swelling is worse at the end of the day, denies paresthesias or worsening weakness in that leg.  Denies recent trauma to the area.  States he has been compliant with his medications.  He denies chest pain, shortness of breath, currently not on hormone therapy.  Patient was last seen here on 12/04  for similar complaints, he is noted to have a indeterminate deep vein thrombosis involving the proximal left femoral vein on 12/4.  At that time patient was anticoagulated on Xarelto, he was placed on a 14-days of Lovenox and his Xarelto was increased.  Unfortunately, patient did not tolerate the increase and Xarelto and was switched to Eliquis.  He states he has been taking this daily.  He denies any alleviating factors.  Patient denies headaches, fevers, chills, shortness of breath, chest pain, abdominal pain, nausea, vomiting, diarrhea.  Past Medical History:  Diagnosis Date  . Diabetes mellitus without complication (HCC)   . Hyperlipemia   . Stroke [facility])     There are no problems to display for this patient.   Past Surgical History:  Procedure Laterality Date  . BRAIN SURGERY         Family History  Problem Relation Age of Onset  . Diabetes Mother   . Diabetes Father   . Heart failure Father   . Hypertension Father     Social History   Tobacco Use  . Smoking status: Never Smoker  . Smokeless tobacco: Never Used  Vaping Use  . Vaping Use: Never used  Substance Use Topics  . Alcohol use: Not  Currently  . Drug use: Not Currently    Home Medications Prior to Admission medications   Medication Sig Start Date End Date Taking? Authorizing Provider  acetaminophen (TYLENOL) 500 MG tablet Take 1,000 mg by mouth every 6 (six) hours as needed for moderate pain.    [provider]  apixaban (ELIQUIS) 5 MG TABS tablet Take 1 tablet (5 mg total) by mouth 2 (two) times daily. 02/26/20 03/27/20  Carroll Sage, PA-C  atorvastatin (LIPITOR) 40 MG tablet Take 40 mg by mouth daily.    [provider]  cholecalciferol (VITAMIN D3) 25 MCG (1000 UNIT) tablet Take 1,000 Units by mouth daily.    [provider]  enoxaparin (LOVENOX) 30 MG/0.3ML injection Inject 0.95 mLs (95 mg total) into the skin every 12 (twelve) hours. 02/08/20   Garlon Hatchet, PA-C  ferrous sulfate 325 (65 FE) MG tablet Take 325 mg by mouth daily with breakfast.    [provider]  levETIRAcetam (KEPPRA) 750 MG tablet Take 750 mg by mouth 2 (two) times daily.    [provider]  magnesium oxide (MAG-OX) 400 MG tablet Take 400 mg by mouth daily.    [provider]  metFORMIN (GLUCOPHAGE) 500 MG tablet Take 500 mg by mouth 2 (two) times daily with a meal.    [provider]  potassium chloride 20 MEQ/15ML (10%) SOLN Take 20  mEq by mouth See admin instructions. Take 15 ml by mouth 3 times a week ( Monday, Wednesday and Friday , must dilute as instructed)    [provider]  rivaroxaban (XARELTO) 20 MG TABS tablet Take 20 mg by mouth daily with supper.    [provider]  topiramate (TOPAMAX) 100 MG tablet Take 100 mg by mouth in the morning, at noon, and at bedtime.    [provider]  vitamin B-12 (CYANOCOBALAMIN) 500 MCG tablet Take 1,000 mcg by mouth daily.    [provider]    Allergies    Patient has no known allergies.  Review of Systems   Review of Systems  Constitutional: Negative for chills and fever.  HENT: Negative  for congestion.   Respiratory: Negative for shortness of breath.   Cardiovascular: Negative for chest pain.  Gastrointestinal: Negative for abdominal pain.  Genitourinary: Negative for enuresis.  Musculoskeletal: Negative for back pain.       Left leg swelling and pain.  Skin: Negative for rash.  Neurological: Negative for headaches.  Hematological: Does not bruise/bleed easily.    Physical Exam Updated Vital Signs BP (!) 150/80   Pulse (!) 59   Temp (!) 97.5 F (36.4 C) (Oral)   Resp 14   Wt 97.5 kg   SpO2 100%   BMI 29.16 kg/m   Physical Exam Vitals and nursing note reviewed.  Constitutional:      General: He is not in acute distress.    Appearance: He is not ill-appearing.  HENT:     Head: Normocephalic and atraumatic.     Nose: No congestion.  Eyes:     Conjunctiva/sclera: Conjunctivae normal.  Cardiovascular:     Rate and Rhythm: Normal rate and regular rhythm.     Pulses: Normal pulses.     Heart sounds: No murmur heard. No friction rub. No gallop.   Pulmonary:     Effort: No respiratory distress.     Breath sounds: No wheezing, rhonchi or rales.  Musculoskeletal:        General: Tenderness present. No signs of injury.     Right lower leg: No edema.     Left lower leg: Edema present.     Comments: Patient's lower extremities were visualized, left leg was larger than the right leg, he had 1+ pitting edema of the left leg going to his shins.  He had full range of motion at at his ankles, knee, hip.  Neurovascular intact.  Skin:    General: Skin is warm and dry.  Neurological:     Mental Status: He is alert.  Psychiatric:        Mood and Affect: Mood normal.     ED Results / Procedures / Treatments   Labs (all labs ordered are listed, but only abnormal results are displayed) Labs Reviewed  COMPREHENSIVE METABOLIC PANEL  CBC WITH DIFFERENTIAL/PLATELET  PROTIME-INR    EKG None  Radiology No results found.  Procedures Procedures   Medications  Ordered in ED Medications - No data to display  ED Course  I have reviewed the triage vital signs and the nursing notes.  Pertinent labs & imaging results that were available during my care of the patient were reviewed by me and considered in my medical decision making (see chart for details).  Clinical Course as of 05/07/20 1349  Fri May 07, 2020  1227 He has a history of DVT in his left lower extremity and is currently on Eliquis.  He said since the diagnosis the leg swelling is never really gone down but it seems worse now has got some pain in his leg.  No new trauma.  Has been compliant with his medications.  No chest pain or shortness of breath.  Getting labs and duplex. [MB]  1304 VAS [place] LOWER EXTREMITY VENOUS (DVT) (ONLY [facility] & [facility]) [WF]  1349 VAS [place] LOWER EXTREMITY VENOUS (DVT) (ONLY [facility] & [facility]) [WF]    Clinical Course User Index [MB] Terrilee Files, MD [WF] Carroll Sage, PA-C   MDM Rules/Calculators/A&P                          Initial impression-patient presents with left leg swelling and pain.  He is alert, does not appear in acute distress, vital signs reassuring.  Concern for possible DVT, will obtain basic lab work, DVT study and reevaluate.  Work-up-patient's DVT study is negative for acute findings.   Rule out-I have low suspicion for septic arthritis as patient denies IV drug use, skin exam was performed no erythematous, edematous, warm joints noted on exam, no new heart murmur heard on exam.  Low suspicion for fracture or dislocation as patient had no recent trauma to the area, full range of motion of his ankle knee and hip, will defer imaging as there is no signs of trauma, no recent trauma history to the area.  Low suspicion for ligament or tendon damage as area was palpated no gross defects noted, he had full range of motion .  Low suspicion for DVT as imaging is negative.  Low suspicion for compartment syndrome as area was palpated it was soft to the touch,  neurovascular fully intact..  Plan-I suspect patient suffering from lymphedema secondary due to previous DVTs.  Will recommend compression stockings, keeping the leg elevated and following up with his PCP.  He sees them on Monday where they will fit him for proper compression socks.  Vital signs have remained stable, no indication for hospital admission.  Patient discussed with attending and they agreed with assessment and plan.  Patient given at home care as well strict return precautions.  Patient verbalized that they understood agreed to said plan.   Final Clinical Impression(s) / ED Diagnoses Final diagnoses:  None    Rx / DC Orders ED Discharge Orders    None       Carroll Sage, PA-C 05/07/20 1326    Terrilee Files, MD 05/07/20 1911

## 2020-05-07 NOTE — Progress Notes (Signed)
Left lower extremity venous duplex has been completed. Preliminary results can be found in CV Proc through chart review.  Results were given to Berle Mull PA.  05/07/20 1:43 PM Olen Cordial RVT

## 2020-05-07 NOTE — ED Triage Notes (Signed)
Pt arrives by [facility] from home due to increasing left leg swelling since Sunday and increasing pain in left leg since yesterday.  No trauma.  Pt has hx of DVT and is on elaquis and taking as prescribed.  No CP or sob.  Pt is alert and oriented. Pt is pt at [place].

## 2020-05-07 NOTE — Discharge Instructions (Signed)
You been seen here for left leg swelling and pain.  I suspect you are suffering from lymphedema.  I have applied a Ace bandage on your leg.  Please take off at night and place on in the mornings.  I also recommend keeping your leg elevated preferably above your heart when at rest this will help decrease swelling.  I would like you to follow-up with your primary care provider on Monday for further evaluation.  Please continuing take your medications as prescribed.  Come back to the emergency department if you develop chest pain, shortness of breath, severe abdominal pain, uncontrolled nausea, vomiting, diarrhea.

## 2020-11-21 ENCOUNTER — Emergency Department (HOSPITAL_COMMUNITY): Payer: No Typology Code available for payment source

## 2020-11-21 ENCOUNTER — Emergency Department (HOSPITAL_COMMUNITY)
Admission: EM | Admit: 2020-11-21 | Discharge: 2020-11-22 | Disposition: A | Discharge status: Home / Self Care | Payer: No Typology Code available for payment source | Attending: Emergency Medicine | Admitting: Emergency Medicine

## 2020-11-21 ENCOUNTER — Encounter (HOSPITAL_COMMUNITY): Payer: Self-pay | Admitting: Emergency Medicine

## 2020-11-21 ENCOUNTER — Other Ambulatory Visit: Payer: Self-pay

## 2020-11-21 DIAGNOSIS — E119 Type 2 diabetes mellitus without complications: Secondary | ICD-10-CM | POA: Insufficient documentation

## 2020-11-21 DIAGNOSIS — Z79899 Other long term (current) drug therapy: Secondary | ICD-10-CM | POA: Diagnosis not present

## 2020-11-21 DIAGNOSIS — U071 COVID-19: Secondary | ICD-10-CM | POA: Diagnosis not present

## 2020-11-21 DIAGNOSIS — R0602 Shortness of breath: Secondary | ICD-10-CM | POA: Diagnosis present

## 2020-11-21 DIAGNOSIS — Z7984 Long term (current) use of oral hypoglycemic drugs: Secondary | ICD-10-CM | POA: Insufficient documentation

## 2020-11-21 DIAGNOSIS — E876 Hypokalemia: Secondary | ICD-10-CM | POA: Insufficient documentation

## 2020-11-21 DIAGNOSIS — Z7901 Long term (current) use of anticoagulants: Secondary | ICD-10-CM | POA: Insufficient documentation

## 2020-11-21 DIAGNOSIS — I10 Essential (primary) hypertension: Secondary | ICD-10-CM | POA: Insufficient documentation

## 2020-11-21 DIAGNOSIS — R0789 Other chest pain: Secondary | ICD-10-CM

## 2020-11-21 LAB — CBC WITH DIFFERENTIAL/PLATELET
Abs Immature Granulocytes: 0.03 10*3/uL (ref 0.00–0.07)
Basophils Absolute: 0 10*3/uL (ref 0.0–0.1)
Basophils Relative: 0 %
Eosinophils Absolute: 0.1 10*3/uL (ref 0.0–0.5)
Eosinophils Relative: 2 %
HCT: 46.2 % (ref 39.0–52.0)
Hemoglobin: 15.1 g/dL (ref 13.0–17.0)
Immature Granulocytes: 1 %
Lymphocytes Relative: 36 %
Lymphs Abs: 2 10*3/uL (ref 0.7–4.0)
MCH: 29.6 pg (ref 26.0–34.0)
MCHC: 32.7 g/dL (ref 30.0–36.0)
MCV: 90.6 fL (ref 80.0–100.0)
Monocytes Absolute: 0.6 10*3/uL (ref 0.1–1.0)
Monocytes Relative: 11 %
Neutro Abs: 2.8 10*3/uL (ref 1.7–7.7)
Neutrophils Relative %: 50 %
Platelets: 193 10*3/uL (ref 150–400)
RBC: 5.1 MIL/uL (ref 4.22–5.81)
RDW: 14 % (ref 11.5–15.5)
WBC: 5.5 10*3/uL (ref 4.0–10.5)
nRBC: 0 % (ref 0.0–0.2)

## 2020-11-21 LAB — COMPREHENSIVE METABOLIC PANEL
ALT: 18 U/L (ref 0–44)
AST: 25 U/L (ref 15–41)
Albumin: 3 g/dL — ABNORMAL LOW (ref 3.5–5.0)
Alkaline Phosphatase: 70 U/L (ref 38–126)
Anion gap: 9 (ref 5–15)
BUN: 15 mg/dL (ref 6–20)
CO2: 24 mmol/L (ref 22–32)
Calcium: 8.4 mg/dL — ABNORMAL LOW (ref 8.9–10.3)
Chloride: 104 mmol/L (ref 98–111)
Creatinine, Ser: 1.22 mg/dL (ref 0.61–1.24)
GFR, Estimated: >60 mL/min (ref >60)
Glucose, Bld: 161 mg/dL — ABNORMAL HIGH (ref 70–99)
Potassium: 3.4 mmol/L — ABNORMAL LOW (ref 3.5–5.1)
Sodium: 137 mmol/L (ref 135–145)
Total Bilirubin: 0.7 mg/dL (ref 0.3–1.2)
Total Protein: 7.1 g/dL (ref 6.5–8.1)

## 2020-11-21 LAB — RESP PANEL BY RT-PCR (FLU A&B, COVID) ARPGX2
Influenza A by PCR: NEGATIVE
Influenza B by PCR: NEGATIVE
SARS Coronavirus 2 by RT PCR: POSITIVE — AB

## 2020-11-21 LAB — BRAIN NATRIURETIC PEPTIDE: B Natriuretic Peptide: 225.9 pg/mL — ABNORMAL HIGH (ref 0.0–100.0)

## 2020-11-21 LAB — TROPONIN I (HIGH SENSITIVITY): Troponin I (High Sensitivity): 18 ng/L — ABNORMAL HIGH (ref <18)

## 2020-11-21 MED ORDER — LACTATED RINGERS IV BOLUS
1000.0000 mL | Freq: Once | INTRAVENOUS | Status: AC
  Administered 2020-11-21: 1000 mL via INTRAVENOUS

## 2020-11-21 MED ORDER — ACETAMINOPHEN 500 MG PO TABS
1000.0000 mg | ORAL_TABLET | Freq: Once | ORAL | Status: AC
  Administered 2020-11-21: 1000 mg via ORAL
  Filled 2020-11-21: qty 2

## 2020-11-21 NOTE — ED Provider Notes (Signed)
Emergency Medicine Provider Triage Evaluation Note  Eddie Conway , a 60 y.o. male  was evaluated in triage.  Pt complains of sternal nonradiating chest pain began at approximately 2:30 PM seem to resolve relatively quickly without intervention soon after.  Seems a duration of symptoms was approximately 1 minute.  Denies any associated nausea vomiting or significant shortness of breath.  He states that he was coughing all night last night and today no hemoptysis.  Review of Systems  Positive: Cough, chest pain Negative: Fever  Physical Exam  There were no vitals taken for this visit. Gen:   Awake, no distress   Resp:  Normal effort  MSK:   Moves extremities without difficulty  Other:  Well-appearing.  Lungs clear to auscultation.  Medical Decision Making  Medically screening exam initiated at 4:15 PM.  Appropriate orders placed.  Vasily Fedewa was informed that the remainder of the evaluation will be completed by another provider, this initial triage assessment does not replace that evaluation, and the importance of remaining in the ED until their evaluation is complete.  No symptoms currently.  Had an episode of chest pain earlier.  Will obtain labs chest x-ray and COVID as he is having some cough.   Gailen Shelter, [place] 11/21/20 1624    Franne Forts, DO 11/23/20 1949

## 2020-11-21 NOTE — ED Triage Notes (Signed)
Pt to triage via [facility] from home.  Reports dry cough and mild SOB all night.  Sudden sharp chest pain 45 min ago 7/10 that lasted a few seconds.  Denies pain at present.  Denies SOB at present.  Went to [employer] last week.

## 2020-11-21 NOTE — ED Notes (Signed)
Unable to draw labs x2

## 2020-11-21 NOTE — ED Provider Notes (Signed)
[facility] EMERGENCY DEPARTMENT Provider Note   CSN: [id number] Arrival date & time: 11/21/20  1543     History Chief Complaint  Patient presents with   Chest Pain    Eddie Conway is a 60 y.o. male with PMHx HTN, HLD, prior CVA, T2DM, DVT on Eliquis who presents for evaluation of chest pain.   Patient reports experiencing non-productive cough, headache, and myalgias which began on Thursday. He reports decreased appetite over this time. No fevers or chills. His wife is feeling sick with similar symptoms after they took a recent trip to [facility] and were exposed to their granddaughter who was not feeling well.   Today, he experienced sharp chest pain around 2:30 PM while drinking water at the kitchen table.  He states that the pain was severe, sharp in character, and located in the center of his chest.  He states that he has a history of GERD but this felt different.  The entire episode lasted about 30 seconds before resolving spontaneously.  He denies any prior episodes of chest pain, as well as subsequent episodes.  He subsequently presented to our emergency department for further evaluation.     Past Medical History:  Diagnosis Date   Diabetes mellitus without complication (HCC)    Hyperlipemia    Stroke (HCC)     There are no problems to display for this patient.   Past Surgical History:  Procedure Laterality Date   BRAIN SURGERY         Family History  Problem Relation Age of Onset   Diabetes Mother    Diabetes Father    Heart failure Father    Hypertension Father     Social History   Tobacco Use   Smoking status: Never   Smokeless tobacco: Never  Vaping Use   Vaping Use: Never used  Substance Use Topics   Alcohol use: Not Currently   Drug use: Not Currently    Home Medications Prior to Admission medications   Medication Sig Start Date End Date Taking? Authorizing Provider  acetaminophen (TYLENOL) 500 MG tablet Take 1,000 mg by  mouth every 6 (six) hours as needed for moderate pain.    [provider]  apixaban (ELIQUIS) 5 MG TABS tablet Take 1 tablet (5 mg total) by mouth 2 (two) times daily. 02/26/20 03/27/20  Carroll Sage, PA-C  atorvastatin (LIPITOR) 40 MG tablet Take 40 mg by mouth daily.    [provider]  cholecalciferol (VITAMIN D3) 25 MCG (1000 UNIT) tablet Take 1,000 Units by mouth daily.    [provider]  enoxaparin (LOVENOX) 30 MG/0.3ML injection Inject 0.95 mLs (95 mg total) into the skin every 12 (twelve) hours. 02/08/20   Garlon Hatchet, PA-C  ferrous sulfate 325 (65 FE) MG tablet Take 325 mg by mouth daily with breakfast.    [provider]  levETIRAcetam (KEPPRA) 750 MG tablet Take 750 mg by mouth 2 (two) times daily.    [provider]  magnesium oxide (MAG-OX) 400 MG tablet Take 400 mg by mouth daily.    [provider]  metFORMIN (GLUCOPHAGE) 500 MG tablet Take 500 mg by mouth 2 (two) times daily with a meal.    [provider]  potassium chloride 20 MEQ/15ML (10%) SOLN Take 20 mEq by mouth See admin instructions. Take 15 ml by mouth 3 times a week ( Monday, Wednesday and Friday , must dilute as instructed)    [provider]  rivaroxaban (  XARELTO) 20 MG TABS tablet Take 20 mg by mouth daily with supper.    [provider]  topiramate (TOPAMAX) 100 MG tablet Take 100 mg by mouth in the morning, at noon, and at bedtime.    [provider]  vitamin B-12 (CYANOCOBALAMIN) 500 MCG tablet Take 1,000 mcg by mouth daily.    [provider]    Allergies    Patient has no known allergies.  Review of Systems   Review of Systems  Constitutional:  Positive for fatigue. Negative for chills and fever.  HENT:  Negative for ear pain and sore throat.   Eyes:  Negative for pain and visual disturbance.  Respiratory:  Positive for cough. Negative for shortness of breath.   Cardiovascular:  Positive for chest  pain. Negative for palpitations.  Gastrointestinal:  Negative for abdominal pain and vomiting.  Genitourinary:  Negative for dysuria and hematuria.  Musculoskeletal:  Positive for myalgias. Negative for arthralgias and back pain.  Skin:  Negative for color change and rash.  Neurological:  Positive for headaches. Negative for seizures and syncope.  All other systems reviewed and are negative.  Physical Exam Updated Vital Signs BP (!) 165/99   Pulse (!) 48   Temp 98.9 F (37.2 C) (Oral)   Resp 10   Ht 6' (1.829 m)   Wt 108 kg   SpO2 95%   BMI 32.28 kg/m   Physical Exam Vitals and nursing note reviewed.  Constitutional:      Appearance: He is well-developed.  HENT:     Head: Normocephalic and atraumatic.     Mouth/Throat:     Mouth: Mucous membranes are dry.  Eyes:     Conjunctiva/sclera: Conjunctivae normal.  Cardiovascular:     Rate and Rhythm: Normal rate and regular rhythm.     Heart sounds: No murmur heard. Pulmonary:     Effort: Pulmonary effort is normal. No respiratory distress.     Breath sounds: Normal breath sounds.     Comments: Lungs clear to auscultation bilaterally.  Normal respiratory rate and effort. Abdominal:     Palpations: Abdomen is soft.     Tenderness: There is no abdominal tenderness.  Musculoskeletal:     Cervical back: Neck supple.  Skin:    General: Skin is warm and dry.  Neurological:     Mental Status: He is alert.    ED Results / Procedures / Treatments   Labs (all labs ordered are listed, but only abnormal results are displayed) Labs Reviewed  RESP PANEL BY RT-PCR (FLU A&B, COVID) ARPGX2 - Abnormal; Notable for the following components:      Result Value   SARS Coronavirus 2 by RT PCR POSITIVE (*)    All other components within normal limits  COMPREHENSIVE METABOLIC PANEL - Abnormal; Notable for the following components:   Potassium 3.4 (*)    Glucose, Bld 161 (*)    Calcium 8.4 (*)    Albumin 3.0 (*)    All other components  within normal limits  BRAIN NATRIURETIC PEPTIDE - Abnormal; Notable for the following components:   B Natriuretic Peptide 225.9 (*)    All other components within normal limits  TROPONIN I (HIGH SENSITIVITY) - Abnormal; Notable for the following components:   Troponin I (High Sensitivity) 18 (*)    All other components within normal limits  CBC WITH DIFFERENTIAL/PLATELET   EKG EKG Interpretation  Date/Time:  Sunday November 21 2020 16:16:41 EDT Ventricular Rate:  65 PR Interval:  200  QRS Duration: 76 QT Interval:  438 QTC Calculation: 455 R Axis:   0 Text Interpretation: Normal sinus rhythm ST & T wave abnormality, consider lateral ischemia Abnormal ECG No significant change since prior 12/21 Confirmed by Meridee Score [phone]) on 11/21/2020 7:25:55 PM  Radiology DG Chest 2 View  Result Date: 11/21/2020 CLINICAL DATA:  Chest pain and shortness of breath.  Dry cough. EXAM: CHEST - 2 VIEW COMPARISON:  Chest CT from 02/06/2020 FINDINGS: Atherosclerotic calcification of the aortic arch. Heart size within normal limits. Mild levoconvex thoracic scoliosis. The lungs appear clear. No blunting of the costophrenic angles. No discrete bony abnormality is identified. IMPRESSION: 1.  No active cardiopulmonary disease is radiographically apparent. 2.  Aortic Atherosclerosis (ICD10-I70.0). 3. Mild levoconvex thoracic scoliosis. Electronically Signed   By: Gaylyn Rong M.D.   On: 11/21/2020 17:06    Procedures Procedures   Medications Ordered in ED Medications  acetaminophen (TYLENOL) tablet 1,000 mg (1,000 mg Oral Given 11/21/20 2026)  lactated ringers bolus 1,000 mL (0 mLs Intravenous Stopped 11/21/20 2221)    ED Course  I have reviewed the triage vital signs and the nursing notes.  Pertinent labs & imaging results that were available during my care of the patient were reviewed by me and considered in my medical decision making (see chart for details).    MDM Rules/Calculators/A&P                            60 y.o. male with past medical history as above who presents for evaluation of chest pain in the setting of constellation of flu-like symptoms and recent sick contacts. Afebrile and hemodynamically stable.  Exam as detailed above.  Initial differential includes (but is not limited to): Viral illness, ACS, PNA, PE, Aortic Dissection, Pancreatitis, Arrhythmia, Pneumothorax, Endo/Myo/Pericarditis, Shingles, Emergent complications of an Ulcer, Esophageal pathology.  CBC without leukocytosis.  CMPwith mild hypokalemia.  Troponin 18.  COVID-19 testing positive.  EKG is NSR.  Chest x-ray with no acute cardiopulmonary abnormality.  I have low suspicion for ACS.  EKG is nonischemic and troponin essentially normal almost 8 hours after episode of chest pain.  No focal lung findings suggestive of pneumonia or pneumothorax.  Low suspicion for pulmonary Embolism based no apparent asymmetric upper extremity or lower extremity edema/swelling suggestive of DVT, shortness of breath, or pleurisy.  Patient denies positional change in chest pain with leaning forward or lying down and has no suggestive EKG changes, making both pericarditis and myocarditis less likely. There does not appear to be an Aortic Dissection  based on physical exam, tearing or ripping, pulses symmetric, and absence of acute focal neurologic deficit.  Musculoskeletal strain and costochondritis are also of consideration; however, pain is not reproducible with palpation.  Presentation is consistent with COVID-19 illness.  Patient was advised regarding supportive care measures at home.  Patient safe for discharge home. Discussed clinical impression and recommendations with the patient at bedside, who voiced understanding and agreement with plan.  Discussed symptomatic treatment and encouraged hydration.  Patient given my usual and customary discussion regarding strict return precautions.  Discussed red flags which would necessitate  return to ED for further evaluation and encouraged follow up with PCP.  Any remaining questions or concerns were solicited and addressed prior to discharge.  Final Clinical Impression(s) / ED Diagnoses Final diagnoses:  Atypical chest pain  COVID-19 virus infection    Rx / DC Orders ED Discharge Orders  None        Holley Dexter, MD 11/22/20 1221    Terrilee Files, MD 11/23/20 [phone]

## 2021-04-04 ENCOUNTER — Encounter (HOSPITAL_COMMUNITY): Payer: Self-pay | Admitting: Internal Medicine

## 2021-04-04 ENCOUNTER — Emergency Department (HOSPITAL_COMMUNITY): Payer: No Typology Code available for payment source

## 2021-04-04 ENCOUNTER — Inpatient Hospital Stay (HOSPITAL_COMMUNITY)
Admission: EM | Admit: 2021-04-04 | Discharge: 2021-04-09 | DRG: 069 | Disposition: A | Discharge status: Home / Self Care | Payer: No Typology Code available for payment source | Attending: Internal Medicine | Admitting: Internal Medicine

## 2021-04-04 DIAGNOSIS — N200 Calculus of kidney: Secondary | ICD-10-CM | POA: Diagnosis present

## 2021-04-04 DIAGNOSIS — R4182 Altered mental status, unspecified: Secondary | ICD-10-CM | POA: Diagnosis not present

## 2021-04-04 DIAGNOSIS — R4781 Slurred speech: Secondary | ICD-10-CM | POA: Diagnosis present

## 2021-04-04 DIAGNOSIS — I10 Essential (primary) hypertension: Secondary | ICD-10-CM | POA: Diagnosis not present

## 2021-04-04 DIAGNOSIS — N179 Acute kidney failure, unspecified: Secondary | ICD-10-CM | POA: Diagnosis not present

## 2021-04-04 DIAGNOSIS — E1165 Type 2 diabetes mellitus with hyperglycemia: Secondary | ICD-10-CM | POA: Diagnosis present

## 2021-04-04 DIAGNOSIS — R569 Unspecified convulsions: Secondary | ICD-10-CM

## 2021-04-04 DIAGNOSIS — Z7901 Long term (current) use of anticoagulants: Secondary | ICD-10-CM

## 2021-04-04 DIAGNOSIS — Z8673 Personal history of transient ischemic attack (TIA), and cerebral infarction without residual deficits: Secondary | ICD-10-CM

## 2021-04-04 DIAGNOSIS — Z79899 Other long term (current) drug therapy: Secondary | ICD-10-CM

## 2021-04-04 DIAGNOSIS — R531 Weakness: Secondary | ICD-10-CM

## 2021-04-04 DIAGNOSIS — F329 Major depressive disorder, single episode, unspecified: Secondary | ICD-10-CM | POA: Insufficient documentation

## 2021-04-04 DIAGNOSIS — Z86718 Personal history of other venous thrombosis and embolism: Secondary | ICD-10-CM | POA: Insufficient documentation

## 2021-04-04 DIAGNOSIS — H53469 Homonymous bilateral field defects, unspecified side: Secondary | ICD-10-CM | POA: Insufficient documentation

## 2021-04-04 DIAGNOSIS — I69354 Hemiplegia and hemiparesis following cerebral infarction affecting left non-dominant side: Secondary | ICD-10-CM

## 2021-04-04 DIAGNOSIS — I480 Paroxysmal atrial fibrillation: Secondary | ICD-10-CM | POA: Diagnosis present

## 2021-04-04 DIAGNOSIS — E876 Hypokalemia: Secondary | ICD-10-CM | POA: Diagnosis not present

## 2021-04-04 DIAGNOSIS — R404 Transient alteration of awareness: Secondary | ICD-10-CM

## 2021-04-04 DIAGNOSIS — E782 Mixed hyperlipidemia: Secondary | ICD-10-CM | POA: Diagnosis not present

## 2021-04-04 DIAGNOSIS — R739 Hyperglycemia, unspecified: Secondary | ICD-10-CM

## 2021-04-04 DIAGNOSIS — Z8249 Family history of ischemic heart disease and other diseases of the circulatory system: Secondary | ICD-10-CM

## 2021-04-04 DIAGNOSIS — G459 Transient cerebral ischemic attack, unspecified: Principal | ICD-10-CM | POA: Diagnosis present

## 2021-04-04 DIAGNOSIS — G9341 Metabolic encephalopathy: Secondary | ICD-10-CM | POA: Diagnosis present

## 2021-04-04 DIAGNOSIS — Z7984 Long term (current) use of oral hypoglycemic drugs: Secondary | ICD-10-CM

## 2021-04-04 DIAGNOSIS — Z20822 Contact with and (suspected) exposure to covid-19: Secondary | ICD-10-CM | POA: Diagnosis present

## 2021-04-04 DIAGNOSIS — R310 Gross hematuria: Secondary | ICD-10-CM | POA: Diagnosis not present

## 2021-04-04 DIAGNOSIS — Z833 Family history of diabetes mellitus: Secondary | ICD-10-CM

## 2021-04-04 DIAGNOSIS — R319 Hematuria, unspecified: Secondary | ICD-10-CM

## 2021-04-04 LAB — COMPREHENSIVE METABOLIC PANEL
ALT: 20 U/L (ref 0–44)
AST: 27 U/L (ref 15–41)
Albumin: 3.9 g/dL (ref 3.5–5.0)
Alkaline Phosphatase: 115 U/L (ref 38–126)
Anion gap: 17 — ABNORMAL HIGH (ref 5–15)
BUN: 18 mg/dL (ref 8–23)
CO2: 21 mmol/L — ABNORMAL LOW (ref 22–32)
Calcium: 9.1 mg/dL (ref 8.9–10.3)
Chloride: 95 mmol/L — ABNORMAL LOW (ref 98–111)
Creatinine, Ser: 1.8 mg/dL — ABNORMAL HIGH (ref 0.61–1.24)
GFR, Estimated: 42 mL/min — ABNORMAL LOW (ref >60)
Glucose, Bld: 586 mg/dL (ref 70–99)
Potassium: 3.9 mmol/L (ref 3.5–5.1)
Sodium: 133 mmol/L — ABNORMAL LOW (ref 135–145)
Total Bilirubin: 0.8 mg/dL (ref 0.3–1.2)
Total Protein: 9.1 g/dL — ABNORMAL HIGH (ref 6.5–8.1)

## 2021-04-04 LAB — CBC WITH DIFFERENTIAL/PLATELET
Abs Immature Granulocytes: 0.03 10*3/uL (ref 0.00–0.07)
Basophils Absolute: 0 10*3/uL (ref 0.0–0.1)
Basophils Relative: 0 %
Eosinophils Absolute: 0 10*3/uL (ref 0.0–0.5)
Eosinophils Relative: 0 %
HCT: 45.5 % (ref 39.0–52.0)
Hemoglobin: 15.3 g/dL (ref 13.0–17.0)
Immature Granulocytes: 0 %
Lymphocytes Relative: 12 %
Lymphs Abs: 1.2 10*3/uL (ref 0.7–4.0)
MCH: 29.4 pg (ref 26.0–34.0)
MCHC: 33.6 g/dL (ref 30.0–36.0)
MCV: 87.5 fL (ref 80.0–100.0)
Monocytes Absolute: 0.5 10*3/uL (ref 0.1–1.0)
Monocytes Relative: 5 %
Neutro Abs: 8.7 10*3/uL — ABNORMAL HIGH (ref 1.7–7.7)
Neutrophils Relative %: 83 %
Platelets: 203 10*3/uL (ref 150–400)
RBC: 5.2 MIL/uL (ref 4.22–5.81)
RDW: 13.9 % (ref 11.5–15.5)
WBC: 10.5 10*3/uL (ref 4.0–10.5)
nRBC: 0 % (ref 0.0–0.2)

## 2021-04-04 LAB — TROPONIN I (HIGH SENSITIVITY)
Troponin I (High Sensitivity): 17 ng/L (ref <18)
Troponin I (High Sensitivity): 15 ng/L (ref <18)

## 2021-04-04 LAB — ETHANOL: Alcohol, Ethyl (B): <10 mg/dL (ref <10)

## 2021-04-04 LAB — CBG MONITORING, ED
Glucose-Capillary: 587 mg/dL (ref 70–99)
Glucose-Capillary: 434 mg/dL — ABNORMAL HIGH (ref 70–99)

## 2021-04-04 LAB — RESP PANEL BY RT-PCR (FLU A&B, COVID) ARPGX2
Influenza A by PCR: NEGATIVE
Influenza B by PCR: NEGATIVE
SARS Coronavirus 2 by RT PCR: NEGATIVE

## 2021-04-04 LAB — AMMONIA: Ammonia: 33 umol/L (ref 9–35)

## 2021-04-04 LAB — SALICYLATE LEVEL: Salicylate Lvl: <7 mg/dL — ABNORMAL LOW (ref 7.0–30.0)

## 2021-04-04 MED ORDER — ONDANSETRON HCL 4 MG/2ML IJ SOLN: 4.0000 mg | Freq: Four times a day (QID) | INTRAMUSCULAR | Status: DC | PRN

## 2021-04-04 MED ORDER — HALOPERIDOL LACTATE 5 MG/ML IJ SOLN
5.0000 mg | Freq: Once | INTRAMUSCULAR | Status: AC
  Administered 2021-04-04: 5 mg via INTRAMUSCULAR
  Filled 2021-04-04: qty 1

## 2021-04-04 MED ORDER — LACTATED RINGERS IV SOLN: INTRAVENOUS | Status: DC

## 2021-04-04 MED ORDER — LACTATED RINGERS IV BOLUS
1000.0000 mL | Freq: Once | INTRAVENOUS | Status: AC
  Administered 2021-04-04: 1000 mL via INTRAVENOUS

## 2021-04-04 MED ORDER — ONDANSETRON HCL 4 MG PO TABS: 4.0000 mg | ORAL_TABLET | Freq: Four times a day (QID) | ORAL | Status: DC | PRN

## 2021-04-04 MED ORDER — INSULIN ASPART 100 UNIT/ML IJ SOLN
15.0000 [IU] | Freq: Once | INTRAMUSCULAR | Status: AC
  Administered 2021-04-04: 15 [IU] via SUBCUTANEOUS

## 2021-04-04 MED ORDER — MAGNESIUM OXIDE -MG SUPPLEMENT 400 (240 MG) MG PO TABS
400.0000 mg | ORAL_TABLET | Freq: Every day | ORAL | Status: DC
  Administered 2021-04-05 – 2021-04-07 (×3): 400 mg via ORAL
  Filled 2021-04-04 (×3): qty 1

## 2021-04-04 MED ORDER — ACETAMINOPHEN 650 MG RE SUPP: 650.0000 mg | Freq: Four times a day (QID) | RECTAL | Status: DC | PRN

## 2021-04-04 MED ORDER — LEVETIRACETAM IN NACL 1500 MG/100ML IV SOLN
1500.0000 mg | Freq: Once | INTRAVENOUS | Status: AC
  Administered 2021-04-04: 1500 mg via INTRAVENOUS
  Filled 2021-04-04: qty 100

## 2021-04-04 MED ORDER — FERROUS SULFATE 325 (65 FE) MG PO TABS
325.0000 mg | ORAL_TABLET | Freq: Every day | ORAL | Status: DC
Start: 2021-04-05 — End: 2021-04-09
  Administered 2021-04-05 – 2021-04-09 (×5): 325 mg via ORAL
  Filled 2021-04-04 (×5): qty 1

## 2021-04-04 MED ORDER — POLYETHYLENE GLYCOL 3350 17 G PO PACK: 17.0000 g | PACK | Freq: Every day | ORAL | Status: DC | PRN

## 2021-04-04 MED ORDER — HYDRALAZINE HCL 20 MG/ML IJ SOLN: 10.0000 mg | Freq: Four times a day (QID) | INTRAMUSCULAR | Status: DC | PRN

## 2021-04-04 MED ORDER — INSULIN ASPART 100 UNIT/ML IJ SOLN
0.0000 [IU] | Freq: Three times a day (TID) | INTRAMUSCULAR | Status: DC
  Administered 2021-04-05: 15 [IU] via SUBCUTANEOUS
  Administered 2021-04-05 (×2): 8 [IU] via SUBCUTANEOUS
  Administered 2021-04-05 – 2021-04-06 (×2): 11 [IU] via SUBCUTANEOUS
  Administered 2021-04-06: 5 [IU] via SUBCUTANEOUS
  Administered 2021-04-06 (×2): 8 [IU] via SUBCUTANEOUS
  Administered 2021-04-07 (×2): 11 [IU] via SUBCUTANEOUS
  Administered 2021-04-07: 5 [IU] via SUBCUTANEOUS
  Administered 2021-04-07: 11 [IU] via SUBCUTANEOUS
  Administered 2021-04-08: 8 [IU] via SUBCUTANEOUS
  Administered 2021-04-08 – 2021-04-09 (×4): 5 [IU] via SUBCUTANEOUS
  Administered 2021-04-09: 3 [IU] via SUBCUTANEOUS

## 2021-04-04 MED ORDER — LEVETIRACETAM 750 MG PO TABS
750.0000 mg | ORAL_TABLET | Freq: Two times a day (BID) | ORAL | Status: DC
  Administered 2021-04-04 – 2021-04-05 (×2): 750 mg via ORAL
  Filled 2021-04-04 (×3): qty 1

## 2021-04-04 MED ORDER — LORAZEPAM 2 MG/ML IJ SOLN
1.0000 mg | Freq: Once | INTRAMUSCULAR | Status: AC
  Administered 2021-04-04: 1 mg via INTRAVENOUS
  Filled 2021-04-04: qty 1

## 2021-04-04 MED ORDER — ACETAMINOPHEN 325 MG PO TABS: 650.0000 mg | ORAL_TABLET | Freq: Four times a day (QID) | ORAL | Status: DC | PRN

## 2021-04-04 MED ORDER — ATORVASTATIN CALCIUM 40 MG PO TABS
40.0000 mg | ORAL_TABLET | Freq: Every day | ORAL | Status: DC
  Administered 2021-04-05 – 2021-04-09 (×5): 40 mg via ORAL
  Filled 2021-04-04 (×5): qty 1

## 2021-04-04 NOTE — ED Notes (Signed)
RN went and checked on pt. Pt now able to state name and city we're in. Pt resting with eyes closed, rise and fall of pt chest noted. Plan of care continues.

## 2021-04-04 NOTE — Assessment & Plan Note (Addendum)
·   Patient presenting with 24-hour history of nonsensical speech, agitation and odd behavior  Due to patient's response to administration of Ativan question as to whether patient is suffering from seizures  According to wife, patient's Keppra that he has been taking prophylactically since his right MCA stroke in 2009 was recently discontinued  Obtaining urinalysis, vitamin B12 level, ammonia, CT head, EEG, TSH  ER provider discussed case with Dr. Lorrin Goodell with neurology who will come evaluate patient in consultation  Patient has been loaded with Keppra by the emergency department staff, will await input by neurology before ordering additional doses.  Gentle intravenous hydration

## 2021-04-04 NOTE — Assessment & Plan Note (Signed)
•   Patient presenting with profound hyperglycemia  No evidence of anion gap  Obtaining serum osmolality  Patient been placed on Accu-Cheks before every meal and nightly with aggressive dosing of sliding scale insulin  Holding home regimen of oral hypoglycemics  Hemoglobin A1C ordered  Diabetic Diet

## 2021-04-04 NOTE — Assessment & Plan Note (Signed)
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
## 2021-04-04 NOTE — Assessment & Plan Note (Signed)
.   Continuing home regimen of lipid lowering therapy.  

## 2021-04-04 NOTE — H&P (Signed)
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  Majority the history is been obtained from the wife, discussion with emergency department staff and review of EMS notes.  Family explains that on Sunday patient attended church with his wife and while at church began to exhibit nonsensical speech.  On the way home and thereafter patient was exhibiting progressively worsening odd behavior confusion and agitation with continued abnormal speech.  Shortly thereafter the patient began to exhibit bouts of nausea and vomiting which resulted in inability to tolerate oral intake and take his medications.  According to wife patient was also complaining of intermittent around the time of the onset of his symptoms.  Patient's symptoms continue to persist the patient eventually suffering a fall at home earlier in the day on 1/30.  This prompted the family to contact EMS who promptly came to evaluate the patient.  Throughout the EMS evaluation, patient became increasingly agitated and confused and the patient was transported to [facility] emergency department for evaluation.  Upon evaluation in the emergency department patient continued to exhibit extreme agitation, attempting to hit multiple staff members.  Patient was administered Haldol followed by Ativan.  At patient's behavior seemed to dramatically improve after administration of Ativan in particular.  Noncontrast CT imaging of the head was performed revealing no evidence of  acute disease.  Patient was found to be profoundly hyperglycemic in the emergency department blood sugar of 586.  ER provider discussed case with Dr.  Lorrin Goodell with neurology and it was felt the patient may be suffering from seizures.  Keppra load was initiated.  Neurology agreed to come see the patient in consultation.     Review of Systems:   Review of Systems  Unable to perform ROS: Mental status change   Past Medical History:  Diagnosis Date   Diabetes mellitus without complication ([place])    Hyperlipemia    Stroke [facility])     Past Surgical History:  Procedure Laterality Date   BRAIN SURGERY       reports that he has never smoked. He has never used smokeless tobacco. He reports that he does not currently use alcohol. He reports that he does not currently use drugs.  No Known Allergies  Family History  Problem Relation Age of Onset   Diabetes Mother    Diabetes Father    Heart failure Father    Hypertension Father      Prior to Admission medications   Medication Sig Start Date End Date Taking? Authorizing Provider  acetaminophen (TYLENOL) 500 MG tablet Take 1,000 mg by mouth every 6 (six) hours as needed for moderate pain.   Yes [provider]  apixaban (ELIQUIS) 5 MG TABS tablet Take 1 tablet (5 mg total) by mouth 2 (two) times daily. 02/26/20 04/04/21 Yes Marcello Fennel, PA-C  atorvastatin (LIPITOR) 40 MG tablet Take 40 mg by mouth daily.   Yes [provider]  cholecalciferol (VITAMIN D3) 25 MCG (1000 UNIT) tablet Take 1,000 Units by mouth daily.   Yes  [provider]  ferrous sulfate 325 (65 FE) MG tablet Take 325 mg by mouth daily with breakfast.   Yes [provider]  levETIRAcetam (KEPPRA) 750 MG tablet Take 750 mg by mouth 2 (two) times daily.   Yes [provider]  magnesium oxide (MAG-OX) 400 MG tablet Take 400 mg by mouth daily.   Yes [provider]  metFORMIN (GLUCOPHAGE) 500 MG tablet Take 500 mg by  mouth 2 (two) times daily with a meal.   Yes [provider]  potassium chloride 20 MEQ/15ML (10%) SOLN Take 20 mEq by mouth See admin instructions. Take 15 ml by mouth 3 times a week ( Monday, Wednesday and Friday , must dilute as instructed)   Yes [provider]  rivaroxaban (XARELTO) 20 MG TABS tablet Take 20 mg by mouth daily with supper.   Yes [provider]  vitamin B-12 (CYANOCOBALAMIN) 500 MCG tablet Take 1,000 mcg by mouth daily.   Yes [provider]  enoxaparin (LOVENOX) 30 MG/0.3ML injection Inject 0.95 mLs (95 mg total) into the skin every 12 (twelve) hours. Patient not taking: Reported on 04/04/2021 02/08/20   Larene Pickett, PA-C  topiramate (TOPAMAX) 100 MG tablet Take 100 mg by mouth in the morning, at noon, and at bedtime. Patient not taking: Reported on 04/04/2021    [provider]    Physical Exam: Vitals:   04/04/21 1845 04/04/21 1915 04/04/21 1930 04/04/21 2000  BP: (!) 164/114 (!) 175/103 (!) 170/104 (!) 171/104  Pulse: 72 76 74 76  Resp: 18 18 15 19   SpO2: 100% 99% 100% 100%    Constitutional: Lethargic but arousable, oriented x2 no associated distress.   Skin: no rashes, no lesions, good skin turgor noted. Eyes: Pupils are equally reactive to light.  No evidence of scleral icterus or conjunctival pallor.  ENMT: Moist mucous membranes noted.  Posterior pharynx clear of any exudate or lesions.   Neck: normal, supple, no masses, no thyromegaly.  No evidence of jugular venous distension.   Respiratory: clear to auscultation bilaterally, no wheezing, no crackles. Normal respiratory effort. No accessory muscle use.  Cardiovascular: Regular rate and rhythm, no murmurs / rubs / gallops. No extremity edema. 2+ pedal pulses. No carotid bruits.  Chest:   Nontender without crepitus or deformity.   Back:   Nontender without crepitus or deformity. Abdomen: Abdomen is soft and nontender.  No evidence of intra-abdominal masses.   Positive bowel sounds noted in all quadrants.   Musculoskeletal: No joint deformity upper and lower extremities. Good ROM, no contractures. Normal muscle tone.  Neurologic: Lethargic but arousable, disoriented, patient is responsive to verbal and painful stimuli.  Patient does move all 4 extremities spontaneously.   Psychiatric: Currently unable to assess due to lethargy.  Currently patient does not seem to possess insight as to his current situation.  Labs on Admission: I have personally reviewed following labs and imaging studies -   CBC: Recent Labs  Lab 04/04/21 1716  WBC 10.5  NEUTROABS 8.7*  HGB 15.3  HCT 45.5  MCV 87.5  PLT 123456   Basic Metabolic Panel: Recent Labs  Lab 04/04/21 1716  NA 133*  K 3.9  CL 95*  CO2 21*  GLUCOSE 586*  BUN 18  CREATININE 1.80*  CALCIUM 9.1   GFR: CrCl cannot be calculated (Unknown ideal weight.). Liver Function Tests: Recent Labs  Lab 04/04/21 1716  AST 27  ALT 20  ALKPHOS 115  BILITOT 0.8  PROT 9.1*  ALBUMIN 3.9   No results for input(s): LIPASE, AMYLASE in the last 168 hours. Recent Labs  Lab 04/04/21 1716  AMMONIA 33   Coagulation Profile: No results for input(s): INR, PROTIME in the last 168 hours. Cardiac Enzymes: No results for input(s): CKTOTAL, CKMB, CKMBINDEX, TROPONINI in the last 168 hours. BNP (last 3 results) No results for input(s): PROBNP in the last 8760 hours. HbA1C: No results for input(s): HGBA1C in the last 72 hours. CBG: Recent Labs  Lab 04/04/21 1714 04/04/21 2253  GLUCAP 587* 434*   Lipid Profile: No results for input(s): CHOL, HDL, LDLCALC, TRIG, CHOLHDL, LDLDIRECT in the last 72 hours. Thyroid Function Tests: No results for input(s): TSH, T4TOTAL, FREET4, T3FREE, THYROIDAB in the last 72 hours. Anemia Panel: No results for input(s): VITAMINB12, FOLATE, FERRITIN, TIBC, IRON, RETICCTPCT in the last 72 hours. Urine analysis: No results found for: COLORURINE, APPEARANCEUR, [place], [place],  GLUCOSEU, HGBUR, BILIRUBINUR, KETONESUR, PROTEINUR, UROBILINOGEN, NITRITE, LEUKOCYTESUR  Radiological Exams on Admission - Personally Reviewed: CT Head Wo Contrast  Result Date: 04/04/2021 CLINICAL DATA:  Altered mental status.  History of stroke. EXAM: CT HEAD WITHOUT CONTRAST TECHNIQUE: Contiguous axial images were obtained from the base of the skull through the vertex without intravenous contrast. RADIATION DOSE REDUCTION: This exam was performed according to the departmental dose-optimization program which includes automated exposure control, adjustment of the mA and/or kV according to patient size and/or use of iterative reconstruction technique. COMPARISON:  02/26/2020 FINDINGS: Brain: Chronic encephalomalacia from the prior large right MCA distribution infarct. Distribution not appreciably changed from 02/26/2020. Periventricular white matter and corona radiata hypodensities favor chronic ischemic microvascular white matter disease. Chronic punctate calcifications in the left frontal lobe parenchyma on images 10 through 12 of series 5, likely dystrophic and incidental. Otherwise, the brainstem, cerebellum, cerebral peduncles, thalamus, basal ganglia, basilar cisterns, and ventricular system appear within normal limits. No intracranial hemorrhage, mass lesion, or acute CVA. Vascular: Unremarkable Skull: Prior right temporal partial craniectomy and right frontotemporoparietal craniotomy. Sinuses/Orbits: Mild chronic ethmoid sinusitis. Other: No supplemental non-categorized findings. IMPRESSION: 1. No acute intracranial findings. 2. Chronic encephalomalacia related to the large right MCA distribution infarct, no change from 02/26/2020. 3. Chronic punctate calcifications in the left frontal lobe, likely dystrophic, unchanged. 4. Mild chronic ethmoid sinusitis. Electronically Signed   By: Van Clines M.D.   On: 04/04/2021 18:07   DG Chest Port 1 View  Result Date: 04/04/2021 CLINICAL DATA:  Altered  level of consciousness, combative EXAM: PORTABLE CHEST 1 VIEW COMPARISON:  11/21/2020 FINDINGS: 2 frontal views of the chest are obtained. Cardiac silhouette is unremarkable given supine AP positioning. No airspace disease, effusion, or pneumothorax. No acute bony abnormalities. IMPRESSION: 1. No acute intrathoracic process. Electronically Signed   By: Randa Ngo M.D.   On: 04/04/2021 18:17    EKG: Personally reviewed.  Rhythm is normal sinus rhythm with heart rate of 72 bpm.  Biatrial enlargement.  No dynamic ST segment changes appreciated.  Assessment/Plan  Assessment and Plan: * Acute metabolic encephalopathy- (present on admission) Patient presenting with 24-hour history of nonsensical speech, agitation and odd behavior Due to patient's response to administration of Ativan question as to whether patient is suffering from seizures According to wife, patient's Keppra that he has been taking prophylactically since his right MCA stroke in 2009 was recently discontinued Obtaining urinalysis, vitamin B12 level, ammonia, CT head, EEG, TSH ER provider discussed case with Dr. Lorrin Goodell with neurology who will come evaluate patient in consultation Patient has been loaded with  Keppra by the emergency department staff, will await input by neurology before ordering additional doses. Gentle intravenous hydration  Uncontrolled type 2 diabetes mellitus with hyperglycemia, without long-term current use of insulin ([place])- (present on admission) Patient presenting with profound hyperglycemia No evidence of anion gap Obtaining serum osmolality Patient been placed on Accu-Cheks before every meal and nightly with aggressive dosing of sliding scale insulin Holding home regimen of oral hypoglycemics Hemoglobin A1C ordered Diabetic Diet   Mixed hyperlipidemia- (present on admission) Continuing home regimen of lipid lowering therapy.   Essential hypertension- (present on admission) Resume patients home  regimen of oral antihypertensives Titrate antihypertensive regimen as necessary to achieve adequate BP control PRN intravenous antihypertensives for excessively elevated blood pressure    History of right MCA stroke Based on history of obtained, patient sustained a large right MCA stroke in 2009 Left-sided hemiparesis as a result No evidence of new focal neurologic deficits on current presentation Fall precautions         Code Status:  Full code  code status decision has been confirmed with: spouse Family Communication: Spouse is at bedside and has been updated on plan of care.  Status is: Observation The patient remains OBS appropriate and will d/c before 2 midnights.  Planned Discharge Destination: Home         Vernelle Emerald MD [facility] Pager [phone]  If 7PM-7AM, please contact night-coverage [email or web address] Use universal [place] password for that web site. If you do not have the password, please call the hospital operator.  04/04/2021, 11:01 PM

## 2021-04-04 NOTE — Assessment & Plan Note (Signed)
.   Resume patients home regimen of oral antihypertensives . Titrate antihypertensive regimen as necessary to achieve adequate BP control . PRN intravenous antihypertensives for excessively elevated blood pressure   

## 2021-04-05 ENCOUNTER — Observation Stay (HOSPITAL_COMMUNITY): Payer: No Typology Code available for payment source

## 2021-04-05 DIAGNOSIS — R4182 Altered mental status, unspecified: Secondary | ICD-10-CM | POA: Diagnosis present

## 2021-04-05 DIAGNOSIS — E782 Mixed hyperlipidemia: Secondary | ICD-10-CM | POA: Diagnosis not present

## 2021-04-05 DIAGNOSIS — Z8249 Family history of ischemic heart disease and other diseases of the circulatory system: Secondary | ICD-10-CM | POA: Diagnosis not present

## 2021-04-05 DIAGNOSIS — I69354 Hemiplegia and hemiparesis following cerebral infarction affecting left non-dominant side: Secondary | ICD-10-CM | POA: Diagnosis not present

## 2021-04-05 DIAGNOSIS — I4891 Unspecified atrial fibrillation: Secondary | ICD-10-CM

## 2021-04-05 DIAGNOSIS — N179 Acute kidney failure, unspecified: Secondary | ICD-10-CM | POA: Diagnosis not present

## 2021-04-05 DIAGNOSIS — E876 Hypokalemia: Secondary | ICD-10-CM | POA: Diagnosis not present

## 2021-04-05 DIAGNOSIS — Z7984 Long term (current) use of oral hypoglycemic drugs: Secondary | ICD-10-CM | POA: Diagnosis not present

## 2021-04-05 DIAGNOSIS — G459 Transient cerebral ischemic attack, unspecified: Secondary | ICD-10-CM | POA: Diagnosis not present

## 2021-04-05 DIAGNOSIS — Z8673 Personal history of transient ischemic attack (TIA), and cerebral infarction without residual deficits: Secondary | ICD-10-CM | POA: Diagnosis not present

## 2021-04-05 DIAGNOSIS — G9341 Metabolic encephalopathy: Secondary | ICD-10-CM | POA: Diagnosis not present

## 2021-04-05 DIAGNOSIS — I480 Paroxysmal atrial fibrillation: Secondary | ICD-10-CM | POA: Diagnosis not present

## 2021-04-05 DIAGNOSIS — Z833 Family history of diabetes mellitus: Secondary | ICD-10-CM | POA: Diagnosis not present

## 2021-04-05 DIAGNOSIS — R310 Gross hematuria: Secondary | ICD-10-CM | POA: Diagnosis not present

## 2021-04-05 DIAGNOSIS — R4781 Slurred speech: Secondary | ICD-10-CM | POA: Diagnosis present

## 2021-04-05 DIAGNOSIS — Z7901 Long term (current) use of anticoagulants: Secondary | ICD-10-CM | POA: Diagnosis not present

## 2021-04-05 DIAGNOSIS — I10 Essential (primary) hypertension: Secondary | ICD-10-CM | POA: Diagnosis not present

## 2021-04-05 DIAGNOSIS — R569 Unspecified convulsions: Secondary | ICD-10-CM | POA: Diagnosis not present

## 2021-04-05 DIAGNOSIS — Z20822 Contact with and (suspected) exposure to covid-19: Secondary | ICD-10-CM | POA: Diagnosis not present

## 2021-04-05 DIAGNOSIS — R404 Transient alteration of awareness: Secondary | ICD-10-CM | POA: Diagnosis not present

## 2021-04-05 DIAGNOSIS — E1165 Type 2 diabetes mellitus with hyperglycemia: Secondary | ICD-10-CM | POA: Diagnosis not present

## 2021-04-05 DIAGNOSIS — Z79899 Other long term (current) drug therapy: Secondary | ICD-10-CM | POA: Diagnosis not present

## 2021-04-05 LAB — CBC WITH DIFFERENTIAL/PLATELET
Abs Immature Granulocytes: 0.04 10*3/uL (ref 0.00–0.07)
Basophils Absolute: 0.1 10*3/uL (ref 0.0–0.1)
Basophils Relative: 1 %
Eosinophils Absolute: 0 10*3/uL (ref 0.0–0.5)
Eosinophils Relative: 0 %
HCT: 44.8 % (ref 39.0–52.0)
Hemoglobin: 14.8 g/dL (ref 13.0–17.0)
Immature Granulocytes: 0 %
Lymphocytes Relative: 25 %
Lymphs Abs: 2.4 10*3/uL (ref 0.7–4.0)
MCH: 29 pg (ref 26.0–34.0)
MCHC: 33 g/dL (ref 30.0–36.0)
MCV: 87.8 fL (ref 80.0–100.0)
Monocytes Absolute: 1.1 10*3/uL — ABNORMAL HIGH (ref 0.1–1.0)
Monocytes Relative: 11 %
Neutro Abs: 6.1 10*3/uL (ref 1.7–7.7)
Neutrophils Relative %: 63 %
Platelets: 198 10*3/uL (ref 150–400)
RBC: 5.1 MIL/uL (ref 4.22–5.81)
RDW: 14.1 % (ref 11.5–15.5)
WBC: 9.7 10*3/uL (ref 4.0–10.5)
nRBC: 0 % (ref 0.0–0.2)

## 2021-04-05 LAB — COMPREHENSIVE METABOLIC PANEL
ALT: 17 U/L (ref 0–44)
AST: 21 U/L (ref 15–41)
Albumin: 3.6 g/dL (ref 3.5–5.0)
Alkaline Phosphatase: 96 U/L (ref 38–126)
Anion gap: 12 (ref 5–15)
BUN: 17 mg/dL (ref 8–23)
CO2: 22 mmol/L (ref 22–32)
Calcium: 9.1 mg/dL (ref 8.9–10.3)
Chloride: 103 mmol/L (ref 98–111)
Creatinine, Ser: 1.54 mg/dL — ABNORMAL HIGH (ref 0.61–1.24)
GFR, Estimated: 51 mL/min — ABNORMAL LOW (ref >60)
Glucose, Bld: 285 mg/dL — ABNORMAL HIGH (ref 70–99)
Potassium: 3.4 mmol/L — ABNORMAL LOW (ref 3.5–5.1)
Sodium: 137 mmol/L (ref 135–145)
Total Bilirubin: 0.7 mg/dL (ref 0.3–1.2)
Total Protein: 8.3 g/dL — ABNORMAL HIGH (ref 6.5–8.1)

## 2021-04-05 LAB — OSMOLALITY: Osmolality: 299 mOsm/kg — ABNORMAL HIGH (ref 275–295)

## 2021-04-05 LAB — GLUCOSE, CAPILLARY
Glucose-Capillary: 265 mg/dL — ABNORMAL HIGH (ref 70–99)
Glucose-Capillary: 305 mg/dL — ABNORMAL HIGH (ref 70–99)
Glucose-Capillary: 258 mg/dL — ABNORMAL HIGH (ref 70–99)

## 2021-04-05 LAB — VITAMIN B12: Vitamin B-12: 1002 pg/mL — ABNORMAL HIGH (ref 180–914)

## 2021-04-05 LAB — HEMOGLOBIN A1C
Hgb A1c MFr Bld: 11.9 % — ABNORMAL HIGH (ref 4.8–5.6)
Mean Plasma Glucose: 294.83 mg/dL

## 2021-04-05 LAB — TSH: TSH: 2.703 u[IU]/mL (ref 0.350–4.500)

## 2021-04-05 LAB — CBG MONITORING, ED: Glucose-Capillary: 373 mg/dL — ABNORMAL HIGH (ref 70–99)

## 2021-04-05 LAB — HIV ANTIBODY (ROUTINE TESTING W REFLEX): HIV Screen 4th Generation wRfx: NONREACTIVE

## 2021-04-05 LAB — MAGNESIUM: Magnesium: 1.9 mg/dL (ref 1.7–2.4)

## 2021-04-05 LAB — FOLATE: Folate: 18.9 ng/mL (ref >5.9)

## 2021-04-05 LAB — C-REACTIVE PROTEIN: CRP: 0.5 mg/dL (ref <1.0)

## 2021-04-05 MED ORDER — DILTIAZEM LOAD VIA INFUSION
10.0000 mg | Freq: Once | INTRAVENOUS | Status: AC
  Administered 2021-04-05: 10 mg via INTRAVENOUS
  Filled 2021-04-05: qty 10

## 2021-04-05 MED ORDER — DILTIAZEM HCL-DEXTROSE 125-5 MG/125ML-% IV SOLN (PREMIX)
5.0000 mg/h | INTRAVENOUS | Status: DC
  Administered 2021-04-05: 5 mg/h via INTRAVENOUS
  Filled 2021-04-05 (×3): qty 125

## 2021-04-05 MED ORDER — APIXABAN 5 MG PO TABS
5.0000 mg | ORAL_TABLET | Freq: Two times a day (BID) | ORAL | Status: DC
  Administered 2021-04-05 – 2021-04-06 (×4): 5 mg via ORAL
  Filled 2021-04-05 (×4): qty 1

## 2021-04-05 MED ORDER — LORAZEPAM 2 MG/ML IJ SOLN
1.0000 mg | Freq: Once | INTRAMUSCULAR | Status: AC | PRN
  Administered 2021-04-05: 1 mg via INTRAVENOUS
  Filled 2021-04-05: qty 1

## 2021-04-05 MED ORDER — POTASSIUM CHLORIDE CRYS ER 20 MEQ PO TBCR
40.0000 meq | EXTENDED_RELEASE_TABLET | Freq: Four times a day (QID) | ORAL | Status: AC
  Administered 2021-04-05 (×2): 40 meq via ORAL
  Filled 2021-04-05 (×2): qty 2

## 2021-04-05 MED ORDER — INSULIN GLARGINE-YFGN 100 UNIT/ML ~~LOC~~ SOLN: 12.0000 [IU] | Freq: Every day | SUBCUTANEOUS | Status: DC

## 2021-04-05 MED ORDER — INSULIN GLARGINE-YFGN 100 UNIT/ML ~~LOC~~ SOLN
16.0000 [IU] | Freq: Every day | SUBCUTANEOUS | Status: DC
  Administered 2021-04-05 – 2021-04-07 (×3): 16 [IU] via SUBCUTANEOUS
  Filled 2021-04-05 (×4): qty 0.16

## 2021-04-05 NOTE — Plan of Care (Signed)
Neurology plan of care:   Patient's EEG was negative except for slowing. No seizures or epileptiform discharges. No need to start maintenance AED. His aphasia was likely due to hyperglycemia.   Neurology will be available for questions should patient have further episodes after sugars are controlled.   Jimmye Norman, MSN, APN-BC Neurology Nurse Practitioner Pager [phone]

## 2021-04-05 NOTE — Procedures (Addendum)
Patient Name: Eddie Conway  MRN: [id number]  Epilepsy Attending: Lora Havens  Referring Physician/Provider: Vernelle Emerald, MD Date: 04/05/2021  Duration: 22.36 mins  Patient history:  61 y.o. 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  EEG to evaluate for seizure  Level of alertness: Awake/lethargic  AEDs during EEG study: LEV  Technical aspects: This EEG study was done with scalp electrodes positioned according to the 10-20 International system of electrode placement. Electrical activity was acquired at a sampling rate of 500Hz  and reviewed with a high frequency filter of 70Hz  and a low frequency filter of 1Hz . EEG data were recorded continuously and digitally stored.   Description: No clear posterior dominant rhythm was seen.  EEG showed continuous generalized 3 to 6 Hz theta-delta slowing admixed with intermittent  generalized 15 to 18 Hz beta activity. Hyperventilation and photic stimulation were not performed.     EKG artifact was seen throughout the study.  ABNORMALITY - Continuous slow, generalized  IMPRESSION: This study is suggestive of moderate diffuse encephalopathy, nonspecific etiology but could be related to sedation. No seizures or epileptiform discharges were seen throughout the recording.  [employer] Barbra Sarks

## 2021-04-05 NOTE — ED Notes (Signed)
Pt given water per request - family remains at bedside

## 2021-04-05 NOTE — Plan of Care (Signed)
°  Problem: Ischemic Stroke/TIA Tissue Perfusion: Goal: Complications of ischemic stroke/TIA will be minimized Outcome: Progressing   Problem: Education: Goal: Expressions of having a comfortable level of knowledge regarding the disease process will increase Outcome: Progressing

## 2021-04-05 NOTE — Progress Notes (Signed)
EEG complete - results pending 

## 2021-04-05 NOTE — ED Notes (Signed)
Breakfast orders placed 

## 2021-04-05 NOTE — Progress Notes (Signed)
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  Patient was noted by his wife to have slurred speech, confusion, she brought him to EMS for further evaluation, he was seen by neurology with concern of partial seizures, EEG with no evidence of seizures, and his CBG was significantly elevated, blood glucose was 586, and A1c of 11.9, as well patient developed A. fib with RVR on the floor.     Assessment & Plan:   Principal Problem:   Acute metabolic encephalopathy Active Problems:   Mixed hyperlipidemia   Uncontrolled type 2 diabetes mellitus with hyperglycemia, without long-term current use of insulin (HCC)   Essential hypertension   History of right MCA stroke   Acute metabolic encephalopathy- (present on admission) - Patient presenting with 24-hour history of nonsensical speech, agitation and odd behavior - Due to patient's response to administration of Ativan question as to whether patient is suffering from seizures - According to wife, patient's Keppra that he has been taking prophylactically since his right MCA stroke in 2009 was recently discontinued -follow UA, vitamin B12, ammonia -This is very likely in the setting of uncontrolled diabetes mellitus, and hyperglycemia as his CBG was 586 on discharge -Wife reports patient had an episode of slurred speech, will check MRI of the brain.    Uncontrolled type 2 diabetes mellitus with hyperglycemia, without long-term current use of insulin ([place])- (present on admission) -He is on metformin, A1c  significantly elevated at 11.9, I have discussed with the patient, that he would likely need to be discharged on insulin, patient report he used to be on insulin in the past and know how to administer, I will discussed with staff to educate patient about insulin, for now we will continue with insulin sliding scale and will add Semglee 16 units.  A. fib with RVR -Afternoon patient was noted to be with heart rate in the 140s, appears to be in A. fib with RVR, upon further questioning patient report he was told in the past he has A. Fib. -Is not on any AV blocking agent, I will start him on Cardizem drip significantly rate in the 140s, TSH within normal limit -Resume Eliquis for anticoagulation.  Mixed hyperlipidemia- (present on admission) - Continuing home regimen of lipid lowering therapy     Essential hypertension- (present on admission) Resume patients home regimen of oral antihypertensives Titrate antihypertensive regimen as necessary to achieve adequate BP control PRN intravenous antihypertensives for excessively elevated blood pressure       History of right MCA stroke Based on history of obtained, patient sustained a large right MCA stroke in 2009 Left-sided hemiparesis as a result No evidence of new focal neurologic deficits on current presentation Fall precautions           DVT prophylaxis: Eliquis Code Status: Full Family Communication: D/W wife at bedside Disposition:   Status is: Observation The patient will require care spanning > 2 midnights and should be moved to inpatient because: A fib, on cardizem drip          Consultants:  Neurology  Subjective:  Patient currently denies any complaints, any focal deficits, denies any palpitation.  Objective: Vitals:   04/05/21 0738 04/05/21 0745 04/05/21 0919 04/05/21 1209  BP: (!) 129/95 (!) 138/94 (!) 138/97 (!) 144/99  Pulse: 72 71 (!) 118 88  Resp: (!) 22 20 16 20   Temp: 99.9 F (37.7 C)  98.9 F (37.2  C) 98.8 F (37.1 C)  TempSrc:   Oral Oral  SpO2: 96% 95% 99% 100%  Weight:   94.8 kg    No intake or output data in the 24 hours ending 04/05/21 1319 Filed Weights   04/05/21 0919  Weight: 94.8 kg    Examination:  Awake Alert, in no apparent distress, left upper extremity contracted symmetrical Chest wall movement, Good air movement bilaterally, CTAB Regular, tachycardic,No Gallops,Rubs or new Murmurs, No Parasternal Heave +ve B.Sounds, Abd Soft, No tenderness, No rebound - guarding or rigidity. No Cyanosis, Clubbing or edema, No new Rash or bruise       Data Reviewed: I have personally reviewed following labs and imaging studies  CBC: Recent Labs  Lab 04/04/21 1716 04/05/21 0312  WBC 10.5 9.7  NEUTROABS 8.7* 6.1  HGB 15.3 14.8  HCT 45.5 44.8  MCV 87.5 87.8  PLT 203 [id number]    Basic Metabolic Panel: Recent Labs  Lab 04/04/21 1716 04/05/21 0312  NA 133* 137  K 3.9 3.4*  CL 95* 103  CO2 21* 22  GLUCOSE 586* 285*  BUN 18 17  CREATININE 1.80* 1.54*  CALCIUM 9.1 9.1  MG  --  1.9    GFR: Estimated Creatinine Clearance: 60.2 mL/min (A) (by C-G formula based on SCr of 1.54 mg/dL (H)).  Liver Function Tests: Recent Labs  Lab 04/04/21 1716 04/05/21 0312  AST 27 21  ALT 20 17  ALKPHOS 115 96  BILITOT 0.8 0.7  PROT 9.1* 8.3*  ALBUMIN 3.9 3.6    CBG: Recent Labs  Lab 04/04/21 1714 04/04/21 2253 04/05/21 0736 04/05/21 1207  GLUCAP 587* 434* 373* 265*     Recent Results (from the past 240 hour(s))  Resp Panel by RT-PCR (Flu A&B, Covid) Nasopharyngeal Swab     Status: None   Collection Time: 04/04/21  4:37 PM   Specimen: Nasopharyngeal Swab; Nasopharyngeal(NP) swabs in vial transport medium  Result Value Ref Range Status   SARS Coronavirus 2 by RT PCR NEGATIVE NEGATIVE Final    Comment: (NOTE) SARS-CoV-2 target nucleic acids are NOT DETECTED.  The SARS-CoV-2 RNA is generally detectable in upper respiratory specimens during the acute phase of  infection. The lowest concentration of SARS-CoV-2 viral copies this assay can detect is 138 copies/mL. A negative result does not preclude SARS-Cov-2 infection and should not be used as the sole basis for treatment or other patient management decisions. A negative result may occur with  improper specimen collection/handling, submission of specimen other than nasopharyngeal swab, presence of viral mutation(s) within the areas targeted by this assay, and inadequate number of viral copies(<138 copies/mL). A negative result must be combined with clinical observations, patient history, and epidemiological information. The expected result is Negative.  Fact Sheet for Patients:  EntrepreneurPulse.com.au  Fact Sheet for Healthcare Providers:  IncredibleEmployment.be  This test is no t yet approved or cleared by the [place] FDA and  has been authorized for detection and/or diagnosis of SARS-CoV-2 by FDA under an Emergency Use Authorization (EUA). This EUA will remain  in effect (meaning this test can be used) for the duration of the COVID-19 declaration under Section 564(b)(1)  of the Act, 21 U.S.C.section 360bbb-3(b)(1), unless the authorization is terminated  or revoked sooner.       Influenza A by PCR NEGATIVE NEGATIVE Final   Influenza B by PCR NEGATIVE NEGATIVE Final    Comment: (NOTE) The Xpert Xpress SARS-CoV-2/FLU/RSV plus assay is intended as an aid in the diagnosis of influenza from Nasopharyngeal swab specimens and should not be used as a sole basis for treatment. Nasal washings and aspirates are unacceptable for Xpert Xpress SARS-CoV-2/FLU/RSV testing.  Fact Sheet for Patients: EntrepreneurPulse.com.au  Fact Sheet for Healthcare Providers: IncredibleEmployment.be  This test is not yet approved or cleared by the [place] FDA and has been authorized for detection and/or diagnosis of SARS-CoV-2  by FDA under an Emergency Use Authorization (EUA). This EUA will remain in effect (meaning this test can be used) for the duration of the COVID-19 declaration under Section 564(b)(1) of the Act, 21 U.S.C. section 360bbb-3(b)(1), unless the authorization is terminated or revoked.  Performed at [facility], [place] [address], [place], [place] [address]          Radiology Studies: CT Head Wo Contrast  Result Date: 04/04/2021 CLINICAL DATA:  Altered mental status.  History of stroke. EXAM: CT HEAD WITHOUT CONTRAST TECHNIQUE: Contiguous axial images were obtained from the base of the skull through the vertex without intravenous contrast. RADIATION DOSE REDUCTION: This exam was performed according to the departmental dose-optimization program which includes automated exposure control, adjustment of the mA and/or kV according to patient size and/or use of iterative reconstruction technique. COMPARISON:  02/26/2020 FINDINGS: Brain: Chronic encephalomalacia from the prior large right MCA distribution infarct. Distribution not appreciably changed from 02/26/2020. Periventricular white matter and corona radiata hypodensities favor chronic ischemic microvascular white matter disease. Chronic punctate calcifications in the left frontal lobe parenchyma on images 10 through 12 of series 5, likely dystrophic and incidental. Otherwise, the brainstem, cerebellum, cerebral peduncles, thalamus, basal ganglia, basilar cisterns, and ventricular system appear within normal limits. No intracranial hemorrhage, mass lesion, or acute CVA. Vascular: Unremarkable Skull: Prior right temporal partial craniectomy and right frontotemporoparietal craniotomy. Sinuses/Orbits: Mild chronic ethmoid sinusitis. Other: No supplemental non-categorized findings. IMPRESSION: 1. No acute intracranial findings. 2. Chronic encephalomalacia related to the large right MCA distribution infarct, no change from 02/26/2020. 3. Chronic punctate  calcifications in the left frontal lobe, likely dystrophic, unchanged. 4. Mild chronic ethmoid sinusitis. Electronically Signed   By: Van Clines M.D.   On: 04/04/2021 18:07   DG Chest Port 1 View  Result Date: 04/04/2021 CLINICAL DATA:  Altered level of consciousness, combative EXAM: PORTABLE CHEST 1 VIEW COMPARISON:  11/21/2020 FINDINGS: 2 frontal views of the chest are obtained. Cardiac silhouette is unremarkable given supine AP positioning. No airspace disease, effusion, or pneumothorax. No acute bony abnormalities. IMPRESSION: 1. No acute intrathoracic process. Electronically Signed   By: Randa Ngo M.D.   On: 04/04/2021 18:17   EEG adult  Result Date: 04/05/2021 Lora Havens, MD     04/05/2021 11:01 AM Patient Name: Amire Sheetz MRN: [id number] Epilepsy Attending: Lora Havens Referring Physician/Provider: Vernelle Emerald, MD Date: 04/05/2021 Duration: 22.36 mins Patient history:  61 y.o. 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  EEG to evaluate for seizure Level of alertness: Awake/lethargic AEDs during EEG study: LEV Technical aspects: This EEG study was done with  scalp electrodes positioned according to the 10-20 International system of electrode placement. Electrical activity was acquired at a sampling rate of 500Hz  and reviewed with a high frequency filter of 70Hz  and a low frequency filter of 1Hz . EEG data were recorded continuously and digitally stored. Description: No clear posterior dominant rhythm was seen.  EEG showed continuous generalized 3 to 6 Hz theta-delta slowing admixed with intermittent  generalized 15 to 18 Hz beta activity. Hyperventilation and photic stimulation were not performed.   EKG artifact was seen throughout the study. ABNORMALITY - Continuous  slow, generalized IMPRESSION: This study is suggestive of moderate diffuse encephalopathy, nonspecific etiology but could be related to sedation. No seizures or epileptiform discharges were seen throughout the recording. Priyanka Barbra Sarks        Scheduled Meds:  apixaban  5 mg Oral BID   atorvastatin  40 mg Oral Daily   diltiazem  10 mg Intravenous Once   ferrous sulfate  325 mg Oral Q breakfast   insulin aspart  0-15 Units Subcutaneous TID AC & HS   insulin glargine-yfgn  16 Units Subcutaneous Daily   levETIRAcetam  750 mg Oral BID   magnesium oxide  400 mg Oral Daily   Continuous Infusions:  diltiazem (CARDIZEM) infusion     lactated ringers 75 mL/hr at 04/05/21 1124     LOS: 0 days       Phillips Climes, MD [facility]   To contact the attending provider between 7A-7P or the covering provider during after hours 7P-7A, please log into the web site [email or web address] and access using universal [place] password for that web site. If you do not have the password, please call the hospital operator.  04/05/2021, 1:19 PM   Patient ID: Zakary Grays, male   DOB: 12/30/1960, 61 y.o.   MRN: [id number]

## 2021-04-05 NOTE — Plan of Care (Signed)
°  Problem: Education: Goal: Knowledge of disease or condition will improve 04/05/2021 2019 by Melvenia Needles, RN Outcome: Progressing 04/05/2021 2001 by Melvenia Needles, RN Outcome: Progressing   Problem: Safety: Goal: Non-violent Restraint(s) 04/05/2021 2019 by Melvenia Needles, RN Outcome: Progressing 04/05/2021 2001 by Melvenia Needles, RN Outcome: Progressing   Problem: Spontaneous Subarachnoid Hemorrhage Tissue Perfusion: Goal: Complications of Spontaneous Subarachnoid Hemorrhage will be minimized 04/05/2021 2019 by Melvenia Needles, RN Outcome: Progressing 04/05/2021 2001 by Melvenia Needles, RN Outcome: Progressing

## 2021-04-05 NOTE — Progress Notes (Signed)
PT Cancellation Note  Patient Details Name: Eddie Conway MRN: [id number] DOB: [birth year]   Cancelled Treatment:    Reason Eval/Treat Not Completed: Other (comment). Pt first seen as Dr. Randol Kern was walking out whom he stated the pt was in a-fib with RVR and HR was in the 140s to wait for 30 min prior to seeing him while they start the cardizem drip. PT then returned and pt was off the floor at MRI. PT to return as able to complete PT eval.  Lewis Shock, PT, DPT Acute Rehabilitation Services Pager #: [phone] Office #: [phone]    Iona Hansen 04/05/2021, 2:48 PM

## 2021-04-05 NOTE — Progress Notes (Signed)
Inpatient Diabetes Program Recommendations  AACE/ADA: New Consensus Statement on Inpatient Glycemic Control (2015)  Target Ranges:  Prepandial:   less than 140 mg/dL      Peak postprandial:   less than 180 mg/dL (1-2 hours)      Critically ill patients:  140 - 180 mg/dL   Lab Results  Component Value Date   GLUCAP 373 (H) 04/05/2021   HGBA1C 11.9 (H) 04/05/2021    Review of Glycemic Control  Latest Reference Range & Units 04/04/21 17:14 04/04/21 22:53 04/05/21 07:36  Glucose-Capillary 70 - 99 mg/dL 321 (HH) 224 (H) 825 (H)  (HH): Data is critically high (H): Data is abnormally high  Diabetes history: DM2 Outpatient Diabetes medications: Metformin 500 mg bid Current orders for Inpatient glycemic control: Novolog correction 0-15 units qid  Inpatient Diabetes Program Recommendations:   -Add basal insulin Semglee 22 units qd (0.2 units/kg x 108 kg)  A1c 11.9 (average blood glucose 295 over the past 2-3 months)  Ordered Living Well With Diabetes for family and patient review. Plan to speak with patient and family regarding diabetes management during hospitalization.  Thank you, Billy Fischer. Ashrita Chrismer, RN, MSN, CDE  Diabetes Coordinator Inpatient Glycemic Control Team Team Pager [phone] (8am-5pm) 04/05/2021 9:24 AM

## 2021-04-05 NOTE — ED Notes (Signed)
Patient has a condom cath. 

## 2021-04-06 ENCOUNTER — Inpatient Hospital Stay (HOSPITAL_COMMUNITY): Payer: No Typology Code available for payment source

## 2021-04-06 DIAGNOSIS — I480 Paroxysmal atrial fibrillation: Secondary | ICD-10-CM

## 2021-04-06 DIAGNOSIS — I4891 Unspecified atrial fibrillation: Secondary | ICD-10-CM

## 2021-04-06 DIAGNOSIS — G9341 Metabolic encephalopathy: Secondary | ICD-10-CM | POA: Diagnosis not present

## 2021-04-06 DIAGNOSIS — E782 Mixed hyperlipidemia: Secondary | ICD-10-CM | POA: Diagnosis not present

## 2021-04-06 DIAGNOSIS — Z8673 Personal history of transient ischemic attack (TIA), and cerebral infarction without residual deficits: Secondary | ICD-10-CM | POA: Diagnosis not present

## 2021-04-06 DIAGNOSIS — I10 Essential (primary) hypertension: Secondary | ICD-10-CM | POA: Diagnosis not present

## 2021-04-06 LAB — BASIC METABOLIC PANEL
Anion gap: 12 (ref 5–15)
BUN: 12 mg/dL (ref 8–23)
CO2: 20 mmol/L — ABNORMAL LOW (ref 22–32)
Calcium: 8.3 mg/dL — ABNORMAL LOW (ref 8.9–10.3)
Chloride: 104 mmol/L (ref 98–111)
Creatinine, Ser: 1.29 mg/dL — ABNORMAL HIGH (ref 0.61–1.24)
GFR, Estimated: >60 mL/min (ref >60)
Glucose, Bld: 182 mg/dL — ABNORMAL HIGH (ref 70–99)
Potassium: 3.2 mmol/L — ABNORMAL LOW (ref 3.5–5.1)
Sodium: 136 mmol/L (ref 135–145)

## 2021-04-06 LAB — URINALYSIS, ROUTINE W REFLEX MICROSCOPIC: RBC / HPF: >50 RBC/hpf — ABNORMAL HIGH (ref 0–5)

## 2021-04-06 LAB — GLUCOSE, CAPILLARY
Glucose-Capillary: 244 mg/dL — ABNORMAL HIGH (ref 70–99)
Glucose-Capillary: 321 mg/dL — ABNORMAL HIGH (ref 70–99)
Glucose-Capillary: 293 mg/dL — ABNORMAL HIGH (ref 70–99)
Glucose-Capillary: 269 mg/dL — ABNORMAL HIGH (ref 70–99)

## 2021-04-06 LAB — CBC
HCT: 44.4 % (ref 39.0–52.0)
Hemoglobin: 15.2 g/dL (ref 13.0–17.0)
MCH: 29.5 pg (ref 26.0–34.0)
MCHC: 34.2 g/dL (ref 30.0–36.0)
MCV: 86.2 fL (ref 80.0–100.0)
Platelets: 180 10*3/uL (ref 150–400)
RBC: 5.15 MIL/uL (ref 4.22–5.81)
RDW: 13.9 % (ref 11.5–15.5)
WBC: 7 10*3/uL (ref 4.0–10.5)
nRBC: 0 % (ref 0.0–0.2)

## 2021-04-06 LAB — RAPID URINE DRUG SCREEN, HOSP PERFORMED
Amphetamines: NOT DETECTED
Barbiturates: NOT DETECTED
Benzodiazepines: POSITIVE — AB
Cocaine: NOT DETECTED
Opiates: NOT DETECTED
Tetrahydrocannabinol: NOT DETECTED

## 2021-04-06 LAB — MAGNESIUM: Magnesium: 1.5 mg/dL — ABNORMAL LOW (ref 1.7–2.4)

## 2021-04-06 LAB — ECHOCARDIOGRAM COMPLETE
AR max vel: 3.32 cm2
AV Area VTI: 3.38 cm2
AV Area mean vel: 3.32 cm2
AV Mean grad: 4 mmHg
AV Peak grad: 8.2 mmHg
Ao pk vel: 1.43 m/s
Area-P 1/2: 2.5 cm2
S' Lateral: 2.5 cm
Single Plane A4C EF: 77.5 %
Weight: 3342.4 oz

## 2021-04-06 LAB — ABO/RH: ABO/RH(D): O POS

## 2021-04-06 MED ORDER — MAGNESIUM SULFATE 2 GM/50ML IV SOLN
2.0000 g | Freq: Once | INTRAVENOUS | Status: AC
  Administered 2021-04-06: 2 g via INTRAVENOUS
  Filled 2021-04-06: qty 50

## 2021-04-06 MED ORDER — POTASSIUM CHLORIDE CRYS ER 20 MEQ PO TBCR
40.0000 meq | EXTENDED_RELEASE_TABLET | Freq: Four times a day (QID) | ORAL | Status: AC
  Administered 2021-04-06 (×2): 40 meq via ORAL
  Filled 2021-04-06 (×2): qty 2

## 2021-04-06 MED ORDER — DILTIAZEM HCL 30 MG PO TABS
60.0000 mg | ORAL_TABLET | Freq: Three times a day (TID) | ORAL | Status: DC
  Administered 2021-04-06 – 2021-04-07 (×4): 60 mg via ORAL
  Filled 2021-04-06 (×4): qty 2

## 2021-04-06 MED ORDER — INSULIN STARTER KIT- PEN NEEDLES (ENGLISH)
1.0000 | Freq: Once | Status: AC
  Administered 2021-04-08: 1
  Filled 2021-04-06 (×2): qty 1

## 2021-04-06 NOTE — TOC Initial Note (Signed)
Transition of Care [facility]) - Initial/Assessment Note    Patient Details  Name: Eddie Conway MRN: [id number] Date of Birth: Nov 29, 1960  [facility] CM/SW Contact:    Pollie Friar, RN Phone Number: 04/06/2021, 2:59 PM  Clinical Narrative:                 Patient lives with his spouse, daughter and daughters boyfriend. The boyfriend is able to provide supervision and transportation to local appointments.  Pt denies issues with home medications. He receives his medications through [facility].  [facility]. He has not seen her yet but states he has an up coming appointment.  SW: Derald Macleod or Jasmin. CM has left Jasmin a voice mail to see about timing of his appointment and [facility]. Pt was attending [facility] at [facility] up until 2 weeks ago but his visits are completed. Recommendations to restart outpatient PT. Pt most likely will need to see his PCP through the [place] before they will sign off on more outpatient PT.  TOC following.  Expected Discharge Plan: OP Rehab Barriers to Discharge: Continued Medical Work up   Patient Goals and CMS Choice     Choice offered to / list presented to : Patient, Spouse  Expected Discharge Plan and Services Expected Discharge Plan: OP Rehab   Discharge Planning Services: CM Consult   Living arrangements for the past 2 months: Apartment                                      Prior Living Arrangements/Services Living arrangements for the past 2 months: Apartment Lives with:: Spouse, Adult Children Patient language and need for interpreter reviewed:: Yes Do you feel safe going back to the place where you live?: Yes      Need for Family Participation in Patient Care: Yes (Comment) Care giver support system in place?: Yes (comment) Current home services: DME (cane) Criminal Activity/Legal Involvement Pertinent to Current  Situation/Hospitalization: No - Comment as needed  Activities of Daily Living      Permission Sought/Granted                  Emotional Assessment Appearance:: Appears stated age Attitude/Demeanor/Rapport: Engaged Affect (typically observed): Accepting Orientation: : Oriented to Self, Oriented to Place, Oriented to Situation   Psych Involvement: No (comment)  Admission diagnosis:  Transient alteration of awareness [R40.4] Partial seizure ([place]) [R56.9] Altered mental status [R41.82] Hyperglycemia [R73.9] Patient Active Problem List   Diagnosis Date Noted   Altered mental status [id number]   Acute metabolic encephalopathy 0000000   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   History of DVT in adulthood 04/04/2021   Major depressive disorder 04/04/2021   Mixed hyperlipidemia 04/04/2021   Uncontrolled type 2 diabetes mellitus with hyperglycemia, without long-term current use of insulin ([place]) 04/04/2021   Essential hypertension 04/04/2021   History of right MCA stroke 04/04/2021   PCP:  Clinic, [place]:   [place], [place] - [place] [address] [place] [address] Phone: [phone] Fax: [phone]  [id number] [address] [place] [address] Phone: [phone] Fax: [phone]     Social Determinants of Health (SDOH) Interventions    Readmission Risk Interventions No flowsheet data found.

## 2021-04-06 NOTE — Progress Notes (Signed)
° °  Echocardiogram 2D Echocardiogram has been performed.  Festus Barren 04/06/2021, 12:04 PM

## 2021-04-06 NOTE — Progress Notes (Signed)
Order for collection of cdiff specimen does not meet criteria to collect per protocol.   Pt WBCs WNL 7.0, afebrile, no abdominal pain or tenderness, stool has been mushy soft, and had " very small squirt" of soft stool.   Pt also admits that he was taking a laxative at home PTA.    Orders for cdiff and enteric precautions d/c'd since all criteria not met.

## 2021-04-06 NOTE — Significant Event (Signed)
Patient was noticed to have frank hematuria by patient's nurse tonight.  Patient has already received his Eliquis for tonight.  We will check serial CBC and type and screen.  If hematuria continues will need to hold Eliquis next dose this morning and further work-up.  We will continue to observe.  UA is pending.    Gean Birchwood

## 2021-04-06 NOTE — Progress Notes (Signed)
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  Patient was noted by his wife to have slurred speech, confusion, she brought him to EMS for further evaluation, he was seen by neurology with concern of partial seizures, EEG with no evidence of seizures, and his CBG was significantly elevated, blood glucose was 586, and A1c of 11.9. Patient admitted for further management.     Assessment & Plan:   Principal Problem:   Acute metabolic encephalopathy Active Problems:   Mixed hyperlipidemia   Uncontrolled type 2 diabetes mellitus with hyperglycemia, without long-term current use of insulin (HCC)   Essential hypertension   History of right MCA stroke   Altered mental status   Acute metabolic encephalopathy- (present on admission) ??TIA Likely due to hyperglycemia Patient presenting with 24-hour history of nonsensical speech, agitation and odd behavior According to wife, patient's Keppra that he has been taking prophylactically since his right MCA stroke in 2009 was recently discontinued EEG with no seizure-like activity MRI brain negative for acute infarct Chest x-ray unremarkable Vitamin B12-->1002, ammonia-->33 UA, UDS pending Monitor closely on telemetry  Uncontrolled type 2 diabetes mellitus with hyperglycemia, without long-term current use of insulin ([place])- (present on admission) A1c significantly elevated at 11.9 SSI, Semglee, Accu-Cheks, hypoglycemic protocol Patient will likely need insulin upon discharge, diabetes coordinator  consulted  Paroxysmal A. fib with RVR Noted to convert to sinus rhythm on 04/06/2021, currently rate controlled TSH WNL Echo showed EF of 60 to 65%, no regional wall motion abnormality, left ventricular diastolic parameters are indeterminate S/p Cardizem drip, switched to recently started p.o. Cardizem Continue Eliquis for anticoagulation Cardiology consulted  Mixed hyperlipidemia- (present on admission) Continue statins   Essential hypertension- (present on admission) Not on any antihypertensives at home Start p.o. Cardizem  Hypokalemia/hypomagnesemia Replace as needed  AKI S/p IV fluids Daily BMP   History of right MCA stroke Large right MCA stroke in 2009, with residual left-sided hemiparesis noted L UEs contracted No new focal neurologic deficits noted Fall precautions     DVT prophylaxis: Eliquis Code Status: Full Family Communication: D/W wife at bedside Disposition: Inpatient     Consultants:  Neurology Cardiology  Subjective:  Patient denies any new complaints, denies any chest pain, dizziness, worsening shortness of breath, slurred speech, abdominal pain, nausea/vomiting, fever/chills.  Objective: Vitals:   04/06/21 0408 04/06/21 0749 04/06/21 1207 04/06/21 1657  BP: (!) 151/116 (!) 131/91 (!) 129/104 (!) 153/111  Pulse: 82 67 74   Resp: _0 Temp: 98.5 F (36.9 C)  98.4 F (36.9 C) (!) 97.5 F (36.4 C)  TempSrc: Oral  Oral Oral  SpO2: 97% 98% 97% 98%  Weight:        Intake/Output Summary (Last 24 hours) at 04/06/2021 1902 Last data filed at 04/06/2021 0548 Gross per 24 hour  Intake 2495.12 ml  Output 1100 ml  Net 1395.12 ml   Filed Weights   04/05/21 0919  Weight: 94.8 kg    Examination: General: NAD, noted huge scar on head Cardiovascular: S1, S2 present Respiratory: CTAB Abdomen: Soft, nontender,  nondistended, bowel sounds present Musculoskeletal: No bilateral pedal edema noted Skin: Normal Psychiatry: Normal mood   Neurology: Noted left upper extremity contracture      Data Reviewed: I have personally reviewed following labs and imaging studies  CBC: Recent Labs  Lab 04/04/21 1716 04/05/21 0312 04/06/21 0352  WBC 10.5 9.7 7.0  NEUTROABS 8.7* 6.1  --   HGB 15.3 14.8 15.2  HCT 45.5 44.8 44.4  MCV 87.5 87.8 86.2  PLT 203 198 735    Basic Metabolic Panel: Recent Labs  Lab 04/04/21 1716 04/05/21 0312 04/06/21 0352  NA 133* 137 136  K 3.9 3.4* 3.2*  CL 95* 103 104  CO2 21* 22 20*  GLUCOSE 586* 285* 182*  BUN _0 CREATININE 1.80* 1.54* 1.29*  CALCIUM 9.1 9.1 8.3*  MG  --  1.9 1.5*    GFR: Estimated Creatinine Clearance: 71.9 mL/min (A) (by C-G formula based on SCr of 1.29 mg/dL (H)).  Liver Function Tests: Recent Labs  Lab 04/04/21 1716 04/05/21 0312  AST 27 21  ALT 20 17  ALKPHOS 115 96  BILITOT 0.8 0.7  PROT 9.1* 8.3*  ALBUMIN 3.9 3.6    CBG: Recent Labs  Lab 04/05/21 1604 04/05/21 2105 04/06/21 0857 04/06/21 1210 04/06/21 1627  GLUCAP 305* 258* 244* 321* 293*     Recent Results (from the past 240 hour(s))  Resp Panel by RT-PCR (Flu A&B, Covid) Nasopharyngeal Swab     Status: None   Collection Time: 04/04/21  4:37 PM   Specimen: Nasopharyngeal Swab; Nasopharyngeal(NP) swabs in vial transport medium  Result Value Ref Range Status   SARS Coronavirus 2 by RT PCR NEGATIVE NEGATIVE Final    Comment: (NOTE) SARS-CoV-2 target nucleic acids are NOT DETECTED.  The SARS-CoV-2 RNA is generally detectable in upper respiratory specimens during the acute phase of infection. The lowest concentration of SARS-CoV-2 viral copies this assay can detect is 138 copies/mL. A negative result does not preclude SARS-Cov-2 infection and should not be used as the sole basis for treatment or other patient management decisions. A negative result may occur with  improper specimen collection/handling, submission of specimen other than nasopharyngeal swab, presence of viral  mutation(s) within the areas targeted by this assay, and inadequate number of viral copies(<138 copies/mL). A negative result must be combined with clinical observations, patient history, and epidemiological information. The expected result is Negative.  Fact Sheet for Patients:  EntrepreneurPulse.com.au  Fact Sheet for Healthcare Providers:  IncredibleEmployment.be  This test is no t yet approved or cleared by the [place] FDA and  has been authorized for detection and/or diagnosis of SARS-CoV-2 by FDA under an Emergency Use Authorization (EUA). This EUA will remain  in effect (meaning this test can be used) for the duration of the COVID-19 declaration under Section 564(b)(1) of the Act, 21 U.S.C.section 360bbb-3(b)(1), unless the authorization is terminated  or revoked sooner.       Influenza A by PCR NEGATIVE NEGATIVE Final   Influenza B by PCR NEGATIVE NEGATIVE Final    Comment: (NOTE) The Xpert Xpress SARS-CoV-2/FLU/RSV plus assay is intended as an aid in the diagnosis of influenza from Nasopharyngeal swab specimens and should not be used as a sole basis for treatment. Nasal washings and aspirates are unacceptable for Xpert Xpress SARS-CoV-2/FLU/RSV testing.  Fact Sheet for Patients: EntrepreneurPulse.com.au  Fact Sheet for Healthcare Providers: IncredibleEmployment.be  This test is not yet approved or cleared by the [place] and has been authorized for  detection and/or diagnosis of SARS-CoV-2 by FDA under an Emergency Use Authorization (EUA). This EUA will remain in effect (meaning this test can be used) for the duration of the COVID-19 declaration under Section 564(b)(1) of the Act, 21 U.S.C. section 360bbb-3(b)(1), unless the authorization is terminated or revoked.  Performed at [facility], [place] [address], [place], [place] [address]          Radiology Studies: MR  BRAIN WO CONTRAST  Result Date: 04/05/2021 CLINICAL DATA:  Acute neuro deficit.  Slurred speech EXAM: MRI HEAD WITHOUT CONTRAST TECHNIQUE: Multiplanar, multiecho pulse sequences of the brain and surrounding structures were obtained without intravenous contrast. COMPARISON:  CT head 11/21/2020 FINDINGS: Brain: Negative for acute infarct Large territory chronic infarct right MCA territory involving basal ganglia. Mild amount of chronic hemorrhage in this infarct. Chronic microhemorrhage in the left frontal lobe. Negative for mass lesion. Right lateral ventricle enlarged due to volume loss from right MCA infarct. Vascular: Normal arterial flow voids Skull and upper cervical spine: No focal skeletal lesion. Sinuses/Orbits: Mild mucosal edema paranasal sinuses. Negative orbit Other: None IMPRESSION: Negative for acute infarct Large territory chronic right MCA infarct. Electronically Signed   By: Franchot Gallo M.D.   On: 04/05/2021 14:29   EEG adult  Result Date: 04/05/2021 Lora Havens, MD     04/05/2021 11:01 AM Patient Name: Graig Hessling MRN: [id number] Epilepsy Attending: Lora Havens Referring Physician/Provider: Vernelle Emerald, MD Date: 04/05/2021 Duration: 22.36 mins Patient history:  61 y.o. 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  EEG to evaluate for seizure Level of alertness: Awake/lethargic AEDs during EEG study: LEV Technical aspects: This EEG study was done with scalp electrodes positioned according to the 10-20 International system of electrode placement. Electrical activity was acquired at a sampling rate of 500Hz and reviewed with a high frequency filter of 70Hz and a low frequency filter of 1Hz. EEG data were recorded continuously and digitally stored. Description: No clear  posterior dominant rhythm was seen.  EEG showed continuous generalized 3 to 6 Hz theta-delta slowing admixed with intermittent  generalized 15 to 18 Hz beta activity. Hyperventilation and photic stimulation were not performed.   EKG artifact was seen throughout the study. ABNORMALITY - Continuous slow, generalized IMPRESSION: This study is suggestive of moderate diffuse encephalopathy, nonspecific etiology but could be related to sedation. No seizures or epileptiform discharges were seen throughout the recording. Lora Havens   ECHOCARDIOGRAM COMPLETE  Result Date: 04/06/2021    ECHOCARDIOGRAM REPORT   Patient Name:   CONSTANTINOS KREMPASKY Date of Exam: 04/06/2021 Medical Rec #:  [id number]      Height:       72.0 in Accession #:    [id number]     Weight:       208.9 lb Date of Birth:  Aug 04, 1960      BSA:          2.170 m Patient Age:    89 years       BP:           131/91 mmHg Patient Gender: M              HR:           77 bpm. Exam Location:  Inpatient Procedure: 2D Echo, Cardiac Doppler and Color Doppler Indications:  I48.91 AFIB  History:        Patient has no prior history of Echocardiogram examinations.                 Stroke; Risk Factors:Diabetes and Dyslipidemia.  Sonographer:    Beryle Beams Referring Phys: [id number] [place]  1. Left ventricular ejection fraction, by estimation, is 60 to 65%. The left ventricle has normal function. The left ventricle has no regional wall motion abnormalities. There is mild concentric left ventricular hypertrophy. Left ventricular diastolic parameters are indeterminate.  2. Right ventricular systolic function is normal. The right ventricular size is normal. Tricuspid regurgitation signal is inadequate for assessing PA pressure.  3. The mitral valve is normal in structure. No evidence of mitral valve regurgitation. No evidence of mitral stenosis.  4. The aortic valve is normal in structure. Aortic valve regurgitation is not visualized. No aortic  stenosis is present.  5. The inferior vena cava is normal in size with greater than 50% respiratory variability, suggesting right atrial pressure of 3 mmHg. Comparison(s): No prior Echocardiogram. FINDINGS  Left Ventricle: Left ventricular ejection fraction, by estimation, is 60 to 65%. The left ventricle has normal function. The left ventricle has no regional wall motion abnormalities. The left ventricular internal cavity size was normal in size. There is  mild concentric left ventricular hypertrophy. Left ventricular diastolic parameters are indeterminate. Right Ventricle: The right ventricular size is normal. No increase in right ventricular wall thickness. Right ventricular systolic function is normal. Tricuspid regurgitation signal is inadequate for assessing PA pressure. Left Atrium: Left atrial size was normal in size. Right Atrium: Right atrial size was normal in size. Pericardium: There is no evidence of pericardial effusion. Mitral Valve: The mitral valve is normal in structure. No evidence of mitral valve regurgitation. No evidence of mitral valve stenosis. Tricuspid Valve: The tricuspid valve is normal in structure. Tricuspid valve regurgitation is not demonstrated. No evidence of tricuspid stenosis. Aortic Valve: The aortic valve is normal in structure. Aortic valve regurgitation is not visualized. No aortic stenosis is present. Aortic valve mean gradient measures 4.0 mmHg. Aortic valve peak gradient measures 8.2 mmHg. Aortic valve area, by VTI measures 3.38 cm. Pulmonic Valve: The pulmonic valve was normal in structure. Pulmonic valve regurgitation is not visualized. No evidence of pulmonic stenosis. Aorta: The aortic root is normal in size and structure. Venous: The inferior vena cava is normal in size with greater than 50% respiratory variability, suggesting right atrial pressure of 3 mmHg. IAS/Shunts: No atrial level shunt detected by color flow Doppler.  LEFT VENTRICLE PLAX 2D LVIDd:         4.60  cm     Diastology LVIDs:         2.50 cm     LV e' medial:    5.78 cm/s LV PW:         1.30 cm     LV E/e' medial:  7.0 LV IVS:        1.20 cm     LV e' lateral:   8.77 cm/s LVOT diam:     2.20 cm     LV E/e' lateral: 4.6 LV SV:         77 LV SV Index:   35 LVOT Area:     3.80 cm  LV Volumes (MOD) LV vol d, MOD A4C: 53.8 ml LV vol s, MOD A4C: 12.1 ml LV SV MOD A4C:     53.8 ml RIGHT VENTRICLE RV S  prime:     22.80 cm/s TAPSE (M-mode): 2.7 cm LEFT ATRIUM             Index        RIGHT ATRIUM           Index LA diam:        3.40 cm 1.57 cm/m   RA Area:     11.60 cm LA Vol (A2C):   57.0 ml 26.26 ml/m  RA Volume:   21.20 ml  9.77 ml/m LA Vol (A4C):   61.5 ml 28.34 ml/m LA Biplane Vol: 60.6 ml 27.92 ml/m  AORTIC VALVE                    PULMONIC VALVE AV Area (Vmax):    3.32 cm     PV Vmax:       0.76 m/s AV Area (Vmean):   3.32 cm     PV Vmean:      44.300 cm/s AV Area (VTI):     3.38 cm     PV VTI:        0.105 m AV Vmax:           143.00 cm/s  PV Peak grad:  2.3 mmHg AV Vmean:          90.900 cm/s  PV Mean grad:  1.0 mmHg AV VTI:            0.227 m AV Peak Grad:      8.2 mmHg AV Mean Grad:      4.0 mmHg LVOT Vmax:         125.00 cm/s LVOT Vmean:        79.300 cm/s LVOT VTI:          0.202 m LVOT/AV VTI ratio: 0.89  AORTA Ao Asc diam: 3.50 cm MITRAL VALVE MV Area (PHT): 2.50 cm    SHUNTS MV Decel Time: 304 msec    Systemic VTI:  0.20 m MV E velocity: 40.70 cm/s  Systemic Diam: 2.20 cm MV A velocity: 60.90 cm/s MV E/A ratio:  0.67 Kardie Tobb DO Electronically signed by Berniece Salines DO Signature Date/Time: 04/06/2021/1:05:18 PM    Final         Scheduled Meds:  apixaban  5 mg Oral BID   atorvastatin  40 mg Oral Daily   diltiazem  60 mg Oral Q8H   ferrous sulfate  325 mg Oral Q breakfast   insulin aspart  0-15 Units Subcutaneous TID AC & HS   insulin glargine-yfgn  16 Units Subcutaneous Daily   insulin starter kit- pen needles  1 kit Other Once   magnesium oxide  400 mg Oral Daily   Continuous  Infusions:     LOS: 1 day       Alma Friendly, MD [facility]   To contact the attending provider between 7A-7P or the covering provider during after hours 7P-7A, please log into the web site [email or web address] and access using universal [place] password for that web site. If you do not have the password, please call the hospital operator.  04/06/2021, 7:02 PM   Patient ID: Taksh Hjort, male   DOB: 06-Apr-1960, 61 y.o.   MRN: [id number]

## 2021-04-06 NOTE — Discharge Instructions (Signed)
Information on my medicine - ELIQUIS (apixaban)  This medication education was reviewed with me or my healthcare representative as part of my discharge preparation.    Why was Eliquis prescribed for you? Eliquis was prescribed to treat blood clots that may have been found in the veins of your legs (deep vein thrombosis) in the past, and to reduce the risk of them occurring again.  What do You need to know about Eliquis ? Your dose is ONE 5 mg tablet taken TWICE daily.  Eliquis may be taken with or without food.   Try to take the dose about the same time in the morning and in the evening. If you have difficulty swallowing the tablet whole please discuss with your pharmacist how to take the medication safely.  Take Eliquis exactly as prescribed and DO NOT stop taking Eliquis without talking to the doctor who prescribed the medication.  Stopping may increase your risk of developing a new blood clot.  Refill your prescription before you run out.  After discharge, you should have regular check-up appointments with your healthcare provider that is prescribing your Eliquis.    What do you do if you miss a dose? If a dose of ELIQUIS is not taken at the scheduled time, take it as soon as possible on the same day and twice-daily administration should be resumed. The dose should not be doubled to make up for a missed dose.  Important Safety Information A possible side effect of Eliquis is bleeding. You should call your healthcare provider right away if you experience any of the following: Bleeding from an injury or your nose that does not stop. Unusual colored urine (red or dark brown) or unusual colored stools (red or black). Unusual bruising for unknown reasons. A serious fall or if you hit your head (even if there is no bleeding).  Some medicines may interact with Eliquis and might increase your risk of bleeding or clotting while on Eliquis. To help avoid this, consult your healthcare  provider or pharmacist prior to using any new prescription or non-prescription medications, including herbals, vitamins, non-steroidal anti-inflammatory drugs (NSAIDs) and supplements.  This website has more information on Eliquis (apixaban): http://www.eliquis.com/eliquis/home

## 2021-04-06 NOTE — Consult Note (Signed)
Cardiology Consultation:   Patient ID: Eddie Conway MRN: [id number]; DOB: May 10, 1960  Admit date: 04/04/2021 Date of Consult: 04/06/2021  PCP:  [facility], [facility]   [facility] Providers Cardiologist:  New, Nakaila Freeze  Click here to update MD or APP on Care Team, Refresh:1}     Patient Profile:   Eddie Conway is a 61 y.o. male with a hx of hyperlipidemia, diabetes mellitus, large right MCA stroke who is being seen 04/06/2021 for the evaluation of new diagnosis of atrial fibrillation with rapid ventricular response at the request of Dr Horris Latino .  History of Present Illness:   Eddie Conway is a 61 year old gentleman with a history of a previous stroke in 2009.  He has a history of poorly controlled diabetes, hyperlipidemia.  He presented to via EMS with confusion and agitation.  Blood glucose is 586.  Hemoglobin A1c is is 11.9. UDS has been ordered, but not completed  ECG from 1/31 shows rapid atrial fib   This afternoon, he has mentally cleared.  Has converted back to NSR .  Is speaking clearly     Past Medical History:  Diagnosis Date   Diabetes mellitus without complication ([place])    Hyperlipemia    Stroke [facility])     Past Surgical History:  Procedure Laterality Date   BRAIN SURGERY       Home Medications:  Prior to Admission medications   Medication Sig Start Date End Date Taking? Authorizing Provider  acetaminophen (TYLENOL) 500 MG tablet Take 1,000 mg by mouth every 6 (six) hours as needed for moderate pain.   Yes [provider]  apixaban (ELIQUIS) 5 MG TABS tablet Take 1 tablet (5 mg total) by mouth 2 (two) times daily. 02/26/20 04/04/21 Yes Marcello Fennel, PA-C  atorvastatin (LIPITOR) 40 MG tablet Take 40 mg by mouth daily.   Yes [provider]  cholecalciferol (VITAMIN D3) 25 MCG (1000 UNIT) tablet Take 1,000 Units by mouth daily.   Yes [provider]  ferrous sulfate 325 (65 FE) MG tablet Take 325 mg by mouth daily with  breakfast.   Yes [provider]  levETIRAcetam (KEPPRA) 750 MG tablet Take 750 mg by mouth 2 (two) times daily.   Yes [provider]  magnesium oxide (MAG-OX) 400 MG tablet Take 400 mg by mouth daily.   Yes [provider]  metFORMIN (GLUCOPHAGE) 500 MG tablet Take 500 mg by mouth 2 (two) times daily with a meal.   Yes [provider]  potassium chloride 20 MEQ/15ML (10%) SOLN Take 20 mEq by mouth See admin instructions. Take 15 ml by mouth 3 times a week ( Monday, Wednesday and Friday , must dilute as instructed)   Yes [provider]  vitamin B-12 (CYANOCOBALAMIN) 500 MCG tablet Take 1,000 mcg by mouth daily.   Yes [provider]  topiramate (TOPAMAX) 100 MG tablet Take 100 mg by mouth in the morning, at noon, and at bedtime. Patient not taking: Reported on 04/04/2021    [provider]    Inpatient Medications: Scheduled Meds:  apixaban  5 mg Oral BID   atorvastatin  40 mg Oral Daily   diltiazem  60 mg Oral Q8H   ferrous sulfate  325 mg Oral Q breakfast   insulin aspart  0-15 Units Subcutaneous TID AC & HS   insulin glargine-yfgn  16 Units Subcutaneous Daily   insulin starter kit- pen needles  1 kit Other Once   magnesium oxide  400 mg Oral Daily  potassium chloride  40 mEq Oral Q6H   Continuous Infusions:  PRN Meds: acetaminophen **OR** acetaminophen, hydrALAZINE, ondansetron **OR** ondansetron (ZOFRAN) IV, polyethylene glycol  Allergies:   No Known Allergies  Social History:   Social History   Socioeconomic History   Marital status: Single    Spouse name: Not on file   Number of children: Not on file   Years of education: Not on file   Highest education level: Not on file  Occupational History   Not on file  Tobacco Use   Smoking status: Never   Smokeless tobacco: Never  Vaping Use   Vaping Use: Never used  Substance and Sexual Activity   Alcohol use: Not Currently   Drug use: Not Currently   Sexual  activity: Not on file  Other Topics Concern   Not on file  Social History Narrative   Not on file   Social Determinants of Health   Financial Resource Strain: Not on file  Food Insecurity: Not on file  Transportation Needs: Not on file  Physical Activity: Not on file  Stress: Not on file  Social Connections: Not on file  Intimate Partner Violence: Not on file    Family History:    Family History  Problem Relation Age of Onset   Diabetes Mother    Diabetes Father    Heart failure Father    Hypertension Father      ROS:  Please see the history of present illness.   All other ROS reviewed and negative.     Physical Exam/Data:   Vitals:   04/05/21 2351 04/06/21 0408 04/06/21 0749 04/06/21 1207  BP: (!) 151/107 (!) 151/116 (!) 131/91 (!) 129/104  Pulse: 78 82 67 74  Resp: _0 Temp: 98.6 F (37 C) 98.5 F (36.9 C)  98.4 F (36.9 C)  TempSrc: Oral Oral  Oral  SpO2: 100% 97% 98% 97%  Weight:        Intake/Output Summary (Last 24 hours) at 04/06/2021 1516 Last data filed at 04/06/2021 0548 Gross per 24 hour  Intake 2495.12 ml  Output 1100 ml  Net 1395.12 ml   Last 3 Weights 04/05/2021 11/21/2020 05/07/2020  Weight (lbs) 208 lb 14.4 oz 238 lb 215 lb  Weight (kg) 94.756 kg 107.956 kg 97.523 kg     Body mass index is 28.33 kg/m.  General:  Well nourished, well developed, in no acute distress HEENT: normal Neck: no JVD Vascular: No carotid bruits; Distal pulses 2+ bilaterally Cardiac:  normal S1, S2; RRR; no murmur  Lungs:  clear to auscultation bilaterally, no wheezing, rhonchi or rales  Abd: soft, nontender, no hepatomegaly  Ext: no edema Musculoskeletal:  No deformities, BUE and BLE strength normal and equal Skin: warm and dry  Neuro:  CNs 2-12 intact, no focal abnormalities noted Psych:  Normal affect   EKG:  The EKG was personally reviewed and demonstrates:      Atrial fibrillation with a heart rate of 136.  Nonspecific ST and T wave changes.     Telemetry:  Telemetry was personally reviewed and demonstrates:  He is currently in normal sinus rhythm.  Relevant CV Studies:   Laboratory Data:  High Sensitivity Troponin:   Recent Labs  Lab 04/04/21 1716 04/04/21 1930  TROPONINIHS 17 15     Chemistry Recent Labs  Lab 04/04/21 1716 04/05/21 0312 04/06/21 0352  NA 133* 137 136  K 3.9 3.4* 3.2*  CL 95* 103 104  CO2 21* 22  20*  GLUCOSE 586* 285* 182*  BUN _0 CREATININE 1.80* 1.54* 1.29*  CALCIUM 9.1 9.1 8.3*  MG  --  1.9 1.5*  GFRNONAA 42* 51* >60  ANIONGAP 17* 12 12    Recent Labs  Lab 04/04/21 1716 04/05/21 0312  PROT 9.1* 8.3*  ALBUMIN 3.9 3.6  AST 27 21  ALT 20 17  ALKPHOS 115 96  BILITOT 0.8 0.7   Lipids No results for input(s): CHOL, TRIG, HDL, LABVLDL, LDLCALC, CHOLHDL in the last 168 hours.  Hematology Recent Labs  Lab 04/04/21 1716 04/05/21 0312 04/06/21 0352  WBC 10.5 9.7 7.0  RBC 5.20 5.10 5.15  HGB 15.3 14.8 15.2  HCT 45.5 44.8 44.4  MCV 87.5 87.8 86.2  MCH 29.4 29.0 29.5  MCHC 33.6 33.0 34.2  RDW 13.9 14.1 13.9  PLT 203 198 180   Thyroid  Recent Labs  Lab 04/04/21 2314  TSH 2.703    BNPNo results for input(s): BNP, PROBNP in the last 168 hours.  DDimer No results for input(s): DDIMER in the last 168 hours.   Radiology/Studies:  CT Head Wo Contrast  Result Date: 04/04/2021 CLINICAL DATA:  Altered mental status.  History of stroke. EXAM: CT HEAD WITHOUT CONTRAST TECHNIQUE: Contiguous axial images were obtained from the base of the skull through the vertex without intravenous contrast. RADIATION DOSE REDUCTION: This exam was performed according to the departmental dose-optimization program which includes automated exposure control, adjustment of the mA and/or kV according to patient size and/or use of iterative reconstruction technique. COMPARISON:  02/26/2020 FINDINGS: Brain: Chronic encephalomalacia from the prior large right MCA distribution infarct. Distribution not  appreciably changed from 02/26/2020. Periventricular white matter and corona radiata hypodensities favor chronic ischemic microvascular white matter disease. Chronic punctate calcifications in the left frontal lobe parenchyma on images 10 through 12 of series 5, likely dystrophic and incidental. Otherwise, the brainstem, cerebellum, cerebral peduncles, thalamus, basal ganglia, basilar cisterns, and ventricular system appear within normal limits. No intracranial hemorrhage, mass lesion, or acute CVA. Vascular: Unremarkable Skull: Prior right temporal partial craniectomy and right frontotemporoparietal craniotomy. Sinuses/Orbits: Mild chronic ethmoid sinusitis. Other: No supplemental non-categorized findings. IMPRESSION: 1. No acute intracranial findings. 2. Chronic encephalomalacia related to the large right MCA distribution infarct, no change from 02/26/2020. 3. Chronic punctate calcifications in the left frontal lobe, likely dystrophic, unchanged. 4. Mild chronic ethmoid sinusitis. Electronically Signed   By: Van Clines M.D.   On: 04/04/2021 18:07   MR BRAIN WO CONTRAST  Result Date: 04/05/2021 CLINICAL DATA:  Acute neuro deficit.  Slurred speech EXAM: MRI HEAD WITHOUT CONTRAST TECHNIQUE: Multiplanar, multiecho pulse sequences of the brain and surrounding structures were obtained without intravenous contrast. COMPARISON:  CT head 11/21/2020 FINDINGS: Brain: Negative for acute infarct Large territory chronic infarct right MCA territory involving basal ganglia. Mild amount of chronic hemorrhage in this infarct. Chronic microhemorrhage in the left frontal lobe. Negative for mass lesion. Right lateral ventricle enlarged due to volume loss from right MCA infarct. Vascular: Normal arterial flow voids Skull and upper cervical spine: No focal skeletal lesion. Sinuses/Orbits: Mild mucosal edema paranasal sinuses. Negative orbit Other: None IMPRESSION: Negative for acute infarct Large territory chronic right MCA  infarct. Electronically Signed   By: Franchot Gallo M.D.   On: 04/05/2021 14:29   DG Chest Port 1 View  Result Date: 04/04/2021 CLINICAL DATA:  Altered level of consciousness, combative EXAM: PORTABLE CHEST 1 VIEW COMPARISON:  11/21/2020 FINDINGS: 2 frontal views of the chest are  obtained. Cardiac silhouette is unremarkable given supine AP positioning. No airspace disease, effusion, or pneumothorax. No acute bony abnormalities. IMPRESSION: 1. No acute intrathoracic process. Electronically Signed   By: Randa Ngo M.D.   On: 04/04/2021 18:17   EEG adult  Result Date: 04/05/2021 Lora Havens, MD     04/05/2021 11:01 AM Patient Name: Eddie Conway MRN: [id number] Epilepsy Attending: Lora Havens Referring Physician/Provider: Vernelle Emerald, MD Date: 04/05/2021 Duration: 22.36 mins Patient history:  61 y.o. 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  EEG to evaluate for seizure Level of alertness: Awake/lethargic AEDs during EEG study: LEV Technical aspects: This EEG study was done with scalp electrodes positioned according to the 10-20 International system of electrode placement. Electrical activity was acquired at a sampling rate of _0  and reviewed with a high frequency filter of _1  and a low frequency filter of _2 . EEG data were recorded continuously and digitally stored. Description: No clear posterior dominant rhythm was seen.  EEG showed continuous generalized 3 to 6 Hz theta-delta slowing admixed with intermittent  generalized 15 to 18 Hz beta activity. Hyperventilation and photic stimulation were not performed.   EKG artifact was seen throughout the study. ABNORMALITY - Continuous slow, generalized IMPRESSION: This study is suggestive of moderate diffuse encephalopathy,  nonspecific etiology but could be related to sedation. No seizures or epileptiform discharges were seen throughout the recording. Lora Havens   ECHOCARDIOGRAM COMPLETE  Result Date: 04/06/2021    ECHOCARDIOGRAM REPORT   Patient Name:   Eddie Conway Date of Exam: 04/06/2021 Medical Rec #:  [id number]      Height:       72.0 in Accession #:    [id number]     Weight:       208.9 lb Date of Birth:  March 05, 1961      BSA:          2.170 m Patient Age:    37 years       BP:           131/91 mmHg Patient Gender: M              HR:           77 bpm. Exam Location:  Inpatient Procedure: 2D Echo, Cardiac Doppler and Color Doppler Indications:    I48.91 AFIB  History:        Patient has no prior history of Echocardiogram examinations.                 Stroke; Risk Factors:Diabetes and Dyslipidemia.  Sonographer:    Beryle Beams Referring Phys: [id number] [place]  1. Left ventricular ejection fraction, by estimation, is 60 to 65%. The left ventricle has normal function. The left ventricle has no regional wall motion abnormalities. There is mild concentric left ventricular hypertrophy. Left ventricular diastolic parameters are indeterminate.  2. Right ventricular systolic function is normal. The right ventricular size is normal. Tricuspid regurgitation signal is inadequate for assessing PA pressure.  3. The mitral valve is normal in structure. No evidence of mitral valve regurgitation. No evidence of mitral stenosis.  4. The aortic valve is normal in structure. Aortic valve regurgitation is not visualized. No aortic stenosis is present.  5. The inferior vena cava is normal in size with greater than 50% respiratory variability, suggesting  right atrial pressure of 3 mmHg. Comparison(s): No prior Echocardiogram. FINDINGS  Left Ventricle: Left ventricular ejection fraction, by estimation, is 60 to 65%. The left ventricle has normal function. The left ventricle has no regional wall motion abnormalities. The  left ventricular internal cavity size was normal in size. There is  mild concentric left ventricular hypertrophy. Left ventricular diastolic parameters are indeterminate. Right Ventricle: The right ventricular size is normal. No increase in right ventricular wall thickness. Right ventricular systolic function is normal. Tricuspid regurgitation signal is inadequate for assessing PA pressure. Left Atrium: Left atrial size was normal in size. Right Atrium: Right atrial size was normal in size. Pericardium: There is no evidence of pericardial effusion. Mitral Valve: The mitral valve is normal in structure. No evidence of mitral valve regurgitation. No evidence of mitral valve stenosis. Tricuspid Valve: The tricuspid valve is normal in structure. Tricuspid valve regurgitation is not demonstrated. No evidence of tricuspid stenosis. Aortic Valve: The aortic valve is normal in structure. Aortic valve regurgitation is not visualized. No aortic stenosis is present. Aortic valve mean gradient measures 4.0 mmHg. Aortic valve peak gradient measures 8.2 mmHg. Aortic valve area, by VTI measures 3.38 cm. Pulmonic Valve: The pulmonic valve was normal in structure. Pulmonic valve regurgitation is not visualized. No evidence of pulmonic stenosis. Aorta: The aortic root is normal in size and structure. Venous: The inferior vena cava is normal in size with greater than 50% respiratory variability, suggesting right atrial pressure of 3 mmHg. IAS/Shunts: No atrial level shunt detected by color flow Doppler.  LEFT VENTRICLE PLAX 2D LVIDd:         4.60 cm     Diastology LVIDs:         2.50 cm     LV e' medial:    5.78 cm/s LV PW:         1.30 cm     LV E/e' medial:  7.0 LV IVS:        1.20 cm     LV e' lateral:   8.77 cm/s LVOT diam:     2.20 cm     LV E/e' lateral: 4.6 LV SV:         77 LV SV Index:   35 LVOT Area:     3.80 cm  LV Volumes (MOD) LV vol d, MOD A4C: 53.8 ml LV vol s, MOD A4C: 12.1 ml LV SV MOD A4C:     53.8 ml RIGHT  VENTRICLE RV S prime:     22.80 cm/s TAPSE (M-mode): 2.7 cm LEFT ATRIUM             Index        RIGHT ATRIUM           Index LA diam:        3.40 cm 1.57 cm/m   RA Area:     11.60 cm LA Vol (A2C):   57.0 ml 26.26 ml/m  RA Volume:   21.20 ml  9.77 ml/m LA Vol (A4C):   61.5 ml 28.34 ml/m LA Biplane Vol: 60.6 ml 27.92 ml/m  AORTIC VALVE                    PULMONIC VALVE AV Area (Vmax):    3.32 cm     PV Vmax:       0.76 m/s AV Area (Vmean):   3.32 cm     PV Vmean:      44.300 cm/s AV Area (VTI):  3.38 cm     PV VTI:        0.105 m AV Vmax:           143.00 cm/s  PV Peak grad:  2.3 mmHg AV Vmean:          90.900 cm/s  PV Mean grad:  1.0 mmHg AV VTI:            0.227 m AV Peak Grad:      8.2 mmHg AV Mean Grad:      4.0 mmHg LVOT Vmax:         125.00 cm/s LVOT Vmean:        79.300 cm/s LVOT VTI:          0.202 m LVOT/AV VTI ratio: 0.89  AORTA Ao Asc diam: 3.50 cm MITRAL VALVE MV Area (PHT): 2.50 cm    SHUNTS MV Decel Time: 304 msec    Systemic VTI:  0.20 m MV E velocity: 40.70 cm/s  Systemic Diam: 2.20 cm MV A velocity: 60.90 cm/s MV E/A ratio:  0.67 Kardie Tobb DO Electronically signed by Berniece Salines DO Signature Date/Time: 04/06/2021/1:05:18 PM    Final      Assessment and Plan:   Paroxysmal atrial fibrillation:  [place]  presented to the hospital with altered mental status, hyperglycemia, rapid atrial fibrillation. He is now converted back to normal sinus rhythm.  Eliquis has been restarted. MRI of the brain reveals no evidence of an acute infarct.  He has a large chronic right MCA infarct.  At this point his heart rate and blood pressure are normal.  I suspect that his episode of paroxysmal atrial fibrillation was due to the acute illness that was also causing his altered mental status.  He should stay on his current medications from a cardiac standpoint.  Echo has been completed and will be read today    Risk Assessment/Risk Scores:      CHA2DS2-VASc Score = 4  :1} This indicates a  4.8% annual risk of stroke. The patient's score is based upon: CHF History: 0 HTN History: 1 Diabetes History: 1 Stroke History: 2 Vascular Disease History: 0 Age Score: 0 Gender Score: 0  If he does well and has no further episodes of Afib , we will anticipate signing off tomorrow .   For questions or updates, please contact [place] Please consult [email or web address] for contact info under    Signed, Mertie Moores, MD  04/06/2021 3:16 PM

## 2021-04-06 NOTE — Evaluation (Signed)
Physical Therapy Evaluation Patient Details Name: Eddie Conway MRN: [id number] DOB: 1960-04-28 Today's Date: 04/06/2021  History of Present Illness  Pt is a 61 y/o male presenting 1/30 to ED for concerns of confusion and agitation.  PMHx HLD, DM2, large R MCA CVA 2009 woth residual L sided hemiplegia/paresis  Clinical Impression  Pt is close to baseline functioning and should be safe at home with available assist while wife is working. There are no further acute PT needs.  Will sign off at this time.        Recommendations for follow up therapy are one component of a multi-disciplinary discharge planning process, led by the attending physician.  Recommendations may be updated based on patient status, additional functional criteria and insurance authorization.  Follow Up Recommendations Outpatient PT ([facility] [place])    Assistance Recommended at Discharge Intermittent Supervision/Assistance  Patient can return home with the following  A lot of help with walking and/or transfers;Assistance with cooking/housework;Assist for transportation;Help with stairs or ramp for entrance    Equipment Recommendations None recommended by PT  Recommendations for Other Services       Functional Status Assessment Patient has had a recent decline in their functional status and demonstrates the ability to make significant improvements in function in a reasonable and predictable amount of time.     Precautions / Restrictions Precautions Precautions: Fall      Mobility  Bed Mobility Overal bed mobility: Modified Independent                  Transfers Overall transfer level: Needs assistance   Transfers: Sit to/from Stand Sit to Stand: Supervision, Modified independent (Device/Increase time)           General transfer comment: pt  used R UE appropriately, no assist, generally steady.    Ambulation/Gait Ambulation/Gait assistance: Min assist, Min guard Gait  Distance (Feet): 90 Feet Assistive device: Quad cane Gait Pattern/deviations: Step-to pattern   Gait velocity interpretation: <1.31 ft/sec, indicative of household ambulator   General Gait Details: stable paretic gait with appropriate use of the quad cane.  Pt able to hold conversation and scan environment without significant deviation and no LOB.  Stairs            Wheelchair Mobility    Modified Rankin (Stroke Patients Only)       Balance Overall balance assessment: Mild deficits observed, not formally tested                                           Pertinent Vitals/Pain Pain Assessment Pain Assessment: Faces Faces Pain Scale: No hurt Pain Intervention(s): Monitored during session    Home Living Family/patient expects to be discharged to:: Private residence Living Arrangements: Spouse/significant other;Children Available Help at Discharge: Family;Other (Comment);Available PRN/intermittently;Available 24 hours/day (dtr's boyfriend) Type of Home: Apartment Home Access: Ramped entrance       Home Layout: One level Home Equipment: Cane - quad;Shower seat;BSC/3in1;Electric scooter;Grab bars - tub/shower      Prior Function Prior Level of Function : Other (comment);Independent/Modified Independent (Dtr's boyfriend around for S/PRN assist)                     Hand Dominance   Dominant Hand: Right    Extremity/Trunk Assessment   Upper Extremity Assessment Upper Extremity Assessment: LUE deficits/detail;RUE deficits/detail RUE Deficits / Details: WFL LUE  Deficits / Details: flaccid with 1 thumb width subluxation GHJ, no pain.    Lower Extremity Assessment Lower Extremity Assessment: LLE deficits/detail;RLE deficits/detail RLE Deficits / Details: WFL LLE Deficits / Details: gross SLR against gravity, syngergist vs isolated movement, paretic gait. LLE Coordination: decreased fine motor    Cervical / Trunk Assessment Cervical /  Trunk Assessment: Kyphotic  Communication   Communication: No difficulties  Cognition Arousal/Alertness: Awake/alert Behavior During Therapy: WFL for tasks assessed/performed Overall Cognitive Status:  (pt likely now at his baseline mentation.  mild imp\ulsiveness)                                          General Comments General comments (skin integrity, edema, etc.): pt report not quite at pre admission baseline, but "not bad"    Exercises     Assessment/Plan    PT Assessment All further PT needs can be met in the next venue of care  PT Problem List Decreased strength;Decreased mobility;Decreased coordination       PT Treatment Interventions      PT Goals (Current goals can be found in the Care Plan section)  Acute Rehab PT Goals Patient Stated Goal: home ASAP,  back to OPPT PT Goal Formulation: All assessment and education complete, DC therapy    Frequency       Co-evaluation               AM-PAC PT "6 Clicks" Mobility  Outcome Measure Help needed turning from your back to your side while in a flat bed without using bedrails?: None Help needed moving from lying on your back to sitting on the side of a flat bed without using bedrails?: None Help needed moving to and from a bed to a chair (including a wheelchair)?: A Little Help needed standing up from a chair using your arms (e.g., wheelchair or bedside chair)?: A Little Help needed to walk in hospital room?: A Little Help needed climbing 3-5 steps with a railing? : A Lot 6 Click Score: 19    End of Session   Activity Tolerance: Patient tolerated treatment well Patient left: in bed;with call bell/phone within reach;with family/visitor present Nurse Communication: Mobility status PT Visit Diagnosis: Other abnormalities of gait and mobility (R26.89);Difficulty in walking, not elsewhere classified (R26.2)    Time: 4287-6811 PT Time Calculation (min) (ACUTE ONLY): 37 min   Charges:   PT  Evaluation $PT Eval Low Complexity: 1 Low PT Treatments $Gait Training: 8-22 mins        04/06/2021  Eddie Carne., PT Acute Rehabilitation Services [phone]  (pager) [phone]  (office)  Eddie Conway 04/06/2021, 1:25 PM

## 2021-04-07 ENCOUNTER — Inpatient Hospital Stay (HOSPITAL_COMMUNITY): Payer: No Typology Code available for payment source

## 2021-04-07 DIAGNOSIS — I480 Paroxysmal atrial fibrillation: Secondary | ICD-10-CM | POA: Diagnosis not present

## 2021-04-07 DIAGNOSIS — E782 Mixed hyperlipidemia: Secondary | ICD-10-CM | POA: Diagnosis not present

## 2021-04-07 DIAGNOSIS — I10 Essential (primary) hypertension: Secondary | ICD-10-CM | POA: Diagnosis not present

## 2021-04-07 DIAGNOSIS — Z8673 Personal history of transient ischemic attack (TIA), and cerebral infarction without residual deficits: Secondary | ICD-10-CM | POA: Diagnosis not present

## 2021-04-07 DIAGNOSIS — G9341 Metabolic encephalopathy: Secondary | ICD-10-CM | POA: Diagnosis not present

## 2021-04-07 LAB — GLUCOSE, CAPILLARY
Glucose-Capillary: 223 mg/dL — ABNORMAL HIGH (ref 70–99)
Glucose-Capillary: 326 mg/dL — ABNORMAL HIGH (ref 70–99)
Glucose-Capillary: 348 mg/dL — ABNORMAL HIGH (ref 70–99)
Glucose-Capillary: 324 mg/dL — ABNORMAL HIGH (ref 70–99)

## 2021-04-07 LAB — CBC
HCT: 43.5 % (ref 39.0–52.0)
HCT: 41.9 % (ref 39.0–52.0)
Hemoglobin: 14.4 g/dL (ref 13.0–17.0)
Hemoglobin: 14 g/dL (ref 13.0–17.0)
MCH: 28.7 pg (ref 26.0–34.0)
MCH: 28.9 pg (ref 26.0–34.0)
MCHC: 33.1 g/dL (ref 30.0–36.0)
MCHC: 33.4 g/dL (ref 30.0–36.0)
MCV: 86.8 fL (ref 80.0–100.0)
MCV: 86.6 fL (ref 80.0–100.0)
Platelets: 177 10*3/uL (ref 150–400)
Platelets: 162 10*3/uL (ref 150–400)
RBC: 5.01 MIL/uL (ref 4.22–5.81)
RBC: 4.84 MIL/uL (ref 4.22–5.81)
RDW: 14.1 % (ref 11.5–15.5)
RDW: 14.1 % (ref 11.5–15.5)
WBC: 7.6 10*3/uL (ref 4.0–10.5)
WBC: 6.4 10*3/uL (ref 4.0–10.5)
nRBC: 0 % (ref 0.0–0.2)
nRBC: 0 % (ref 0.0–0.2)

## 2021-04-07 LAB — BASIC METABOLIC PANEL
Anion gap: 10 (ref 5–15)
BUN: 12 mg/dL (ref 8–23)
CO2: 20 mmol/L — ABNORMAL LOW (ref 22–32)
Calcium: 8.4 mg/dL — ABNORMAL LOW (ref 8.9–10.3)
Chloride: 106 mmol/L (ref 98–111)
Creatinine, Ser: 1.32 mg/dL — ABNORMAL HIGH (ref 0.61–1.24)
GFR, Estimated: >60 mL/min (ref >60)
Glucose, Bld: 120 mg/dL — ABNORMAL HIGH (ref 70–99)
Potassium: 3 mmol/L — ABNORMAL LOW (ref 3.5–5.1)
Sodium: 136 mmol/L (ref 135–145)

## 2021-04-07 LAB — MAGNESIUM: Magnesium: 1.9 mg/dL (ref 1.7–2.4)

## 2021-04-07 MED ORDER — DILTIAZEM HCL ER COATED BEADS 180 MG PO CP24
180.0000 mg | ORAL_CAPSULE | Freq: Every day | ORAL | Status: DC
  Administered 2021-04-08 – 2021-04-09 (×2): 180 mg via ORAL
  Filled 2021-04-07 (×2): qty 1

## 2021-04-07 MED ORDER — DILTIAZEM HCL 30 MG PO TABS
60.0000 mg | ORAL_TABLET | Freq: Three times a day (TID) | ORAL | Status: AC
  Administered 2021-04-07 (×2): 60 mg via ORAL
  Filled 2021-04-07 (×2): qty 2

## 2021-04-07 MED ORDER — LOSARTAN POTASSIUM 25 MG PO TABS
25.0000 mg | ORAL_TABLET | Freq: Every day | ORAL | Status: DC
  Administered 2021-04-07 – 2021-04-09 (×3): 25 mg via ORAL
  Filled 2021-04-07 (×3): qty 1

## 2021-04-07 MED ORDER — INSULIN GLARGINE-YFGN 100 UNIT/ML ~~LOC~~ SOLN
20.0000 [IU] | Freq: Every day | SUBCUTANEOUS | Status: DC
  Administered 2021-04-08 – 2021-04-09 (×2): 20 [IU] via SUBCUTANEOUS
  Filled 2021-04-07 (×2): qty 0.2

## 2021-04-07 MED ORDER — INSULIN ASPART 100 UNIT/ML IJ SOLN
3.0000 [IU] | Freq: Three times a day (TID) | INTRAMUSCULAR | Status: DC
  Administered 2021-04-07 – 2021-04-08 (×3): 3 [IU] via SUBCUTANEOUS

## 2021-04-07 MED ORDER — POTASSIUM CHLORIDE CRYS ER 20 MEQ PO TBCR
40.0000 meq | EXTENDED_RELEASE_TABLET | Freq: Two times a day (BID) | ORAL | Status: AC
  Administered 2021-04-07 – 2021-04-08 (×4): 40 meq via ORAL
  Filled 2021-04-07 (×4): qty 2

## 2021-04-07 MED ORDER — LIVING WELL WITH DIABETES BOOK
Freq: Once | Status: AC
  Filled 2021-04-07: qty 1

## 2021-04-07 NOTE — Progress Notes (Addendum)
Inpatient Diabetes Program Recommendations  AACE/ADA: New Consensus Statement on Inpatient Glycemic Control (2015)  Target Ranges:  Prepandial:   less than 140 mg/dL      Peak postprandial:   less than 180 mg/dL (1-2 hours)      Critically ill patients:  140 - 180 mg/dL   Lab Results  Component Value Date   GLUCAP 223 (H) 04/07/2021   HGBA1C 11.9 (H) 04/05/2021    Review of Glycemic Control  Latest Reference Range & Units 04/06/21 08:57 04/06/21 12:10 04/06/21 16:27 04/06/21 21:25 04/07/21 06:36  Glucose-Capillary 70 - 99 mg/dL 244 (H) 321 (H) 293 (H) 269 (H) 223 (H)  (H): Data is abnormally high  Diabetes history: DM2 Outpatient Diabetes medications: Metformin 500 mg BID Current orders for Inpatient glycemic control: Semglee 16 units QD, Novolog 0-15 units TID and HS  Inpatient Diabetes Program Recommendations:    1-Semglee 20 units QD (could administer additional 4 units now as he received AM dose of 16 already) 2-Novolog 3 units TID with meals  Addendum_0 :  Educated patient on insulin pen use at home. Reviewed contents of insulin flexpen starter kit. Reviewed all steps of insulin pen including attachment of needle, 2-unit air shot, dialing up dose, giving injection, removing needle, disposal of sharps, storage of unused insulin, disposal of insulin etc. Patient able to provide successful return demonstration. Also reviewed troubleshooting with insulin pen. MD to give patient Rxs for insulin pens and insulin pen needles.  Discussed hypoglycemia, signs, symptoms and treatments.  He has been on insulin in the past and is aware how to use the insulin pen.    Will speak with patient today.    Will continue to follow while inpatient.  Thank you, Reche Dixon, MSN, RN Diabetes Coordinator Inpatient Diabetes Program [phone] (team pager from 8a-5p)

## 2021-04-07 NOTE — TOC Progression Note (Signed)
Transition of Care [facility]) - Progression Note    Patient Details  Name: Eddie Conway MRN: [id number] Date of Birth: Jul 20, 1960  Transition of Care [facility]) CM/SW Contact  Kermit Balo, RN Phone Number: 04/07/2021, 2:03 PM  Clinical Narrative:    No return calls from [place] social workers: Bertina: [phone] ext [phone]  or Jasmin: [phone] ext [phone] CM has left multiple voicemails. Will need to know where to send orders for outpatient therapy and see about making sure pt has transportation arranged through the [facility] for his upcoming appointment.   Expected Discharge Plan: OP Rehab Barriers to Discharge: Continued Medical Work up  Expected Discharge Plan and Services Expected Discharge Plan: OP Rehab   Discharge Planning Services: CM Consult   Living arrangements for the past 2 months: Apartment                                       Social Determinants of Health (SDOH) Interventions    Readmission Risk Interventions No flowsheet data found.

## 2021-04-07 NOTE — Plan of Care (Signed)
°  Problem: Safety: Goal: Non-violent Restraint(s) Outcome: Progressing   Problem: Ischemic Stroke/TIA Tissue Perfusion: Goal: Complications of ischemic stroke/TIA will be minimized Outcome: Progressing

## 2021-04-07 NOTE — Progress Notes (Signed)
Progress Note  Patient Name: Eddie Conway Date of Encounter: 04/07/2021  [facility] Cardiologist: None   Subjective   Had hematuria overnight and apixaban held  Also episode of numbness and tingling in his right fingers. MRI head with no new acute lesions.  Doing okay this morning. Continues to have hematuria and apixaban has been held. Numbness and tingling has resolved. Remains in NSR. Inpatient Medications    Scheduled Meds:  atorvastatin  40 mg Oral Daily   diltiazem  60 mg Oral Q8H   ferrous sulfate  325 mg Oral Q breakfast   insulin aspart  0-15 Units Subcutaneous TID AC & HS   insulin glargine-yfgn  16 Units Subcutaneous Daily   insulin starter kit- pen needles  1 kit Other Once   magnesium oxide  400 mg Oral Daily   potassium chloride  40 mEq Oral BID   Continuous Infusions:  PRN Meds: acetaminophen **OR** acetaminophen, hydrALAZINE, ondansetron **OR** ondansetron (ZOFRAN) IV, polyethylene glycol   Vital Signs    Vitals:   04/06/21 1657 04/06/21 1931 04/06/21 2352 04/07/21 0810  BP: (!) 153/111 (!) 140/97 (!) 148/88 (!) 156/90  Pulse:  72 65 69  Resp: $Remo'20 17 18 16  'QmJSm$ Temp: (!) 97.5 F (36.4 C) 98.8 F (37.1 C) 98.7 F (37.1 C) 98.6 F (37 C)  TempSrc: Oral Oral Oral Oral  SpO2: 98% 94% 96% 98%  Weight:        Intake/Output Summary (Last 24 hours) at 04/07/2021 0934 Last data filed at 04/07/2021 7416 Gross per 24 hour  Intake 180 ml  Output 100 ml  Net 80 ml   Last 3 Weights 04/05/2021 11/21/2020 05/07/2020  Weight (lbs) 208 lb 14.4 oz 238 lb 215 lb  Weight (kg) 94.756 kg 107.956 kg 97.523 kg      Telemetry    NSR HR 60s - Personally Reviewed  ECG    No new tracing - Personally Reviewed  Physical Exam   GEN: No acute distress.   Neck: No JVD Cardiac: RRR, no murmurs, rubs, or gallops.  Respiratory: Clear to auscultation bilaterally. GI: Soft, nontender, non-distended  MS: No edema; No deformity. Neuro:  Left sided hemiparesis Psych:  Normal affect   Labs    High Sensitivity Troponin:   Recent Labs  Lab 04/04/21 1716 04/04/21 1930  TROPONINIHS 17 15     Chemistry Recent Labs  Lab 04/04/21 1716 04/05/21 0312 04/06/21 0352 04/07/21 0206  NA 133* 137 136 136  K 3.9 3.4* 3.2* 3.0*  CL 95* 103 104 106  CO2 21* 22 20* 20*  GLUCOSE 586* 285* 182* 120*  BUN $Re'18 17 12 12  'qJR$ CREATININE 1.80* 1.54* 1.29* 1.32*  CALCIUM 9.1 9.1 8.3* 8.4*  MG  --  1.9 1.5* 1.9  PROT 9.1* 8.3*  --   --   ALBUMIN 3.9 3.6  --   --   AST 27 21  --   --   ALT 20 17  --   --   ALKPHOS 115 96  --   --   BILITOT 0.8 0.7  --   --   GFRNONAA 42* 51* >60 >60  ANIONGAP 17* $RemoveBefo'12 12 10    'VRufbTTsEBK$ Lipids No results for input(s): CHOL, TRIG, HDL, LABVLDL, LDLCALC, CHOLHDL in the last 168 hours.  Hematology Recent Labs  Lab 04/06/21 0352 04/07/21 0206 04/07/21 0603  WBC 7.0 7.6 6.4  RBC 5.15 5.01 4.84  HGB 15.2 14.4 14.0  HCT 44.4 43.5 41.9  MCV  86.2 86.8 86.6  MCH 29.5 28.7 28.9  MCHC 34.2 33.1 33.4  RDW 13.9 14.1 14.1  PLT 180 177 162   Thyroid  Recent Labs  Lab 04/04/21 2314  TSH 2.703    BNPNo results for input(s): BNP, PROBNP in the last 168 hours.  DDimer No results for input(s): DDIMER in the last 168 hours.   Radiology    MR BRAIN WO CONTRAST  Result Date: 04/05/2021 CLINICAL DATA:  Acute neuro deficit.  Slurred speech EXAM: MRI HEAD WITHOUT CONTRAST TECHNIQUE: Multiplanar, multiecho pulse sequences of the brain and surrounding structures were obtained without intravenous contrast. COMPARISON:  CT head 11/21/2020 FINDINGS: Brain: Negative for acute infarct Large territory chronic infarct right MCA territory involving basal ganglia. Mild amount of chronic hemorrhage in this infarct. Chronic microhemorrhage in the left frontal lobe. Negative for mass lesion. Right lateral ventricle enlarged due to volume loss from right MCA infarct. Vascular: Normal arterial flow voids Skull and upper cervical spine: No focal skeletal lesion.  Sinuses/Orbits: Mild mucosal edema paranasal sinuses. Negative orbit Other: None IMPRESSION: Negative for acute infarct Large territory chronic right MCA infarct. Electronically Signed   By: Franchot Gallo M.D.   On: 04/05/2021 14:29   EEG adult  Result Date: 04/05/2021 Lora Havens, MD     04/05/2021 11:01 AM Patient Name: Eddie Conway MRN: [id number] Epilepsy Attending: Lora Havens Referring Physician/Provider: Vernelle Emerald, MD Date: 04/05/2021 Duration: 22.36 mins Patient history:  61 y.o. 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  EEG to evaluate for seizure Level of alertness: Awake/lethargic AEDs during EEG study: LEV Technical aspects: This EEG study was done with scalp electrodes positioned according to the 10-20 International system of electrode placement. Electrical activity was acquired at a sampling rate of $Remov'500Hz'CCUrGx$  and reviewed with a high frequency filter of $RemoveB'70Hz'GZiUDtwp$  and a low frequency filter of $RemoveB'1Hz'rvkkMzmq$ . EEG data were recorded continuously and digitally stored. Description: No clear posterior dominant rhythm was seen.  EEG showed continuous generalized 3 to 6 Hz theta-delta slowing admixed with intermittent  generalized 15 to 18 Hz beta activity. Hyperventilation and photic stimulation were not performed.   EKG artifact was seen throughout the study. ABNORMALITY - Continuous slow, generalized IMPRESSION: This study is suggestive of moderate diffuse encephalopathy, nonspecific etiology but could be related to sedation. No seizures or epileptiform discharges were seen throughout the recording. Lora Havens   ECHOCARDIOGRAM COMPLETE  Result Date: 04/06/2021    ECHOCARDIOGRAM REPORT   Patient Name:   Eddie Conway Date of Exam: 04/06/2021 Medical Rec #:  [id number]      Height:        72.0 in Accession #:    [id number]     Weight:       208.9 lb Date of Birth:  12-10-1960      BSA:          2.170 m Patient Age:    61 years       BP:           131/91 mmHg Patient Gender: M              HR:           77 bpm. Exam Location:  Inpatient Procedure: 2D Echo, Cardiac Doppler and Color Doppler Indications:    I48.91 AFIB  History:  Patient has no prior history of Echocardiogram examinations.                 Stroke; Risk Factors:Diabetes and Dyslipidemia.  Sonographer:    Beryle Beams Referring Phys: [id number] Haivana Nakya  1. Left ventricular ejection fraction, by estimation, is 60 to 65%. The left ventricle has normal function. The left ventricle has no regional wall motion abnormalities. There is mild concentric left ventricular hypertrophy. Left ventricular diastolic parameters are indeterminate.  2. Right ventricular systolic function is normal. The right ventricular size is normal. Tricuspid regurgitation signal is inadequate for assessing PA pressure.  3. The mitral valve is normal in structure. No evidence of mitral valve regurgitation. No evidence of mitral stenosis.  4. The aortic valve is normal in structure. Aortic valve regurgitation is not visualized. No aortic stenosis is present.  5. The inferior vena cava is normal in size with greater than 50% respiratory variability, suggesting right atrial pressure of 3 mmHg. Comparison(s): No prior Echocardiogram. FINDINGS  Left Ventricle: Left ventricular ejection fraction, by estimation, is 60 to 65%. The left ventricle has normal function. The left ventricle has no regional wall motion abnormalities. The left ventricular internal cavity size was normal in size. There is  mild concentric left ventricular hypertrophy. Left ventricular diastolic parameters are indeterminate. Right Ventricle: The right ventricular size is normal. No increase in right ventricular wall thickness. Right ventricular systolic function is normal.  Tricuspid regurgitation signal is inadequate for assessing PA pressure. Left Atrium: Left atrial size was normal in size. Right Atrium: Right atrial size was normal in size. Pericardium: There is no evidence of pericardial effusion. Mitral Valve: The mitral valve is normal in structure. No evidence of mitral valve regurgitation. No evidence of mitral valve stenosis. Tricuspid Valve: The tricuspid valve is normal in structure. Tricuspid valve regurgitation is not demonstrated. No evidence of tricuspid stenosis. Aortic Valve: The aortic valve is normal in structure. Aortic valve regurgitation is not visualized. No aortic stenosis is present. Aortic valve mean gradient measures 4.0 mmHg. Aortic valve peak gradient measures 8.2 mmHg. Aortic valve area, by VTI measures 3.38 cm. Pulmonic Valve: The pulmonic valve was normal in structure. Pulmonic valve regurgitation is not visualized. No evidence of pulmonic stenosis. Aorta: The aortic root is normal in size and structure. Venous: The inferior vena cava is normal in size with greater than 50% respiratory variability, suggesting right atrial pressure of 3 mmHg. IAS/Shunts: No atrial level shunt detected by color flow Doppler.  LEFT VENTRICLE PLAX 2D LVIDd:         4.60 cm     Diastology LVIDs:         2.50 cm     LV e' medial:    5.78 cm/s LV PW:         1.30 cm     LV E/e' medial:  7.0 LV IVS:        1.20 cm     LV e' lateral:   8.77 cm/s LVOT diam:     2.20 cm     LV E/e' lateral: 4.6 LV SV:         77 LV SV Index:   35 LVOT Area:     3.80 cm  LV Volumes (MOD) LV vol d, MOD A4C: 53.8 ml LV vol s, MOD A4C: 12.1 ml LV SV MOD A4C:     53.8 ml RIGHT VENTRICLE RV S prime:     22.80 cm/s TAPSE (M-mode): 2.7 cm LEFT  ATRIUM             Index        RIGHT ATRIUM           Index LA diam:        3.40 cm 1.57 cm/m   RA Area:     11.60 cm LA Vol (A2C):   57.0 ml 26.26 ml/m  RA Volume:   21.20 ml  9.77 ml/m LA Vol (A4C):   61.5 ml 28.34 ml/m LA Biplane Vol: 60.6 ml 27.92 ml/m   AORTIC VALVE                    PULMONIC VALVE AV Area (Vmax):    3.32 cm     PV Vmax:       0.76 m/s AV Area (Vmean):   3.32 cm     PV Vmean:      44.300 cm/s AV Area (VTI):     3.38 cm     PV VTI:        0.105 m AV Vmax:           143.00 cm/s  PV Peak grad:  2.3 mmHg AV Vmean:          90.900 cm/s  PV Mean grad:  1.0 mmHg AV VTI:            0.227 m AV Peak Grad:      8.2 mmHg AV Mean Grad:      4.0 mmHg LVOT Vmax:         125.00 cm/s LVOT Vmean:        79.300 cm/s LVOT VTI:          0.202 m LVOT/AV VTI ratio: 0.89  AORTA Ao Asc diam: 3.50 cm MITRAL VALVE MV Area (PHT): 2.50 cm    SHUNTS MV Decel Time: 304 msec    Systemic VTI:  0.20 m MV E velocity: 40.70 cm/s  Systemic Diam: 2.20 cm MV A velocity: 60.90 cm/s MV E/A ratio:  0.67 Kardie Tobb DO Electronically signed by Berniece Salines DO Signature Date/Time: 04/06/2021/1:05:18 PM    Final     Cardiac Studies   TTE 04/06/21: IMPRESSIONS   1. Left ventricular ejection fraction, by estimation, is 60 to 65%. The  left ventricle has normal function. The left ventricle has no regional  wall motion abnormalities. There is mild concentric left ventricular  hypertrophy. Left ventricular diastolic  parameters are indeterminate.   2. Right ventricular systolic function is normal. The right ventricular  size is normal. Tricuspid regurgitation signal is inadequate for assessing  PA pressure.   3. The mitral valve is normal in structure. No evidence of mitral valve  regurgitation. No evidence of mitral stenosis.   4. The aortic valve is normal in structure. Aortic valve regurgitation is  not visualized. No aortic stenosis is present.   5. The inferior vena cava is normal in size with greater than 50%  respiratory variability, suggesting right atrial pressure of 3 mmHg.   Comparison(s): No prior Echocardiogram.  Patient Profile     61 y.o. 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   Assessment & Plan    #Paroxsymal Afib: CHADs-vasc 4. TTE with normal BiV function with LVEF 60-65%, no significant  valve disease. Likely episode of Afib with RVR developed in the setting of acute illness with hyperglycemia to the 500s. Now back in NSR. Apixaban held overnight due to frank hematuria. -Continue dilt $RemoveBefore'60mg'IZzrscMvgqmDy$  TID; change to long-acting tomorrow -Holding apixaban due to frank hematuria; resume once cleared from a urologic standpoint  #Acute Metabolic Encephalopathy: Improving. Likely due to hyperglycemia. MRI brain negative for acute infarct -Management per primary  #Uncontrolled DMII with hyperglycemia: A1C 11. BG 500s on admission. Will need aggressive control. -Management per primary  #Prior MCA stroke:  Residual left sided weakness. MRI overnight with large chronic right hemisphere infarct with no new lesions.  -Management per primary -Needs aggressive DM control  #HTN: -Continue dilt as above -Start losartan $RemoveBeforeD'25mg'DrVTLoURSmGbOX$  daily   Cardiology will sign-off. Resume apixaban once cleared from urological standpoint. Will change dilt to long-acting. Will arrange for CV follow-up post-discharge.   For questions or updates, please contact [place] Please consult [email or web address] for contact info under        Signed, Freada Bergeron, MD  04/07/2021, 9:34 AM

## 2021-04-07 NOTE — Plan of Care (Signed)
°  Problem: Education: Goal: Knowledge of disease or condition will improve Outcome: Progressing   Problem: Education: Goal: Expressions of having a comfortable level of knowledge regarding the disease process will increase Outcome: Progressing   Problem: Coping: Goal: Ability to identify appropriate support needs will improve Outcome: Progressing

## 2021-04-07 NOTE — Significant Event (Signed)
Patient's nurse notified me about 6:50 AM April 07, 2021 that patient was complaining of tingling and numbness of his right upper extremity fingers.  On exam at bedside patient states that his symptoms have been ongoing for the last 24 hours.  Patient has no new weakness.  On exam patient does have weakness on the left upper extremity which is 0/5 from previous stroke and left lower extremity is 3 x 5 and right upper and lower extremities are 5 x 5.  No facial asymmetry tongue is midline pupils are equal and reacting to light.  Discussed with on-call neurologist Dr. Reed Breech.  Neurologist advised me to get MRI brain.  Further plans based on MRI and they will be seeing patient.  Gean Birchwood.

## 2021-04-07 NOTE — Progress Notes (Signed)
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  Patient was noted by his wife to have slurred speech, confusion, she brought him to EMS for further evaluation, he was seen by neurology with concern of partial seizures, EEG with no evidence of seizures, and his CBG was significantly elevated, blood glucose was 586, and A1c of 11.9. Patient admitted for further management.     Assessment & Plan:   Principal Problem:   Acute metabolic encephalopathy Active Problems:   Mixed hyperlipidemia   Uncontrolled type 2 diabetes mellitus with hyperglycemia, without long-term current use of insulin (HCC)   Essential hypertension   History of right MCA stroke   Altered mental status   Acute metabolic encephalopathy- (present on admission) ??TIA Reported RUE numbness and tingling of his fingers- repeat MRI brain neg Patient presenting with 24-hour history of nonsensical speech, agitation and odd behavior According to wife, patient's Keppra that he has been taking prophylactically since his right MCA stroke in 2009 was recently discontinued EEG with no seizure-like activity MRI brain X 2 negative for acute infarct Chest x-ray unremarkable Vitamin B12-->1002, ammonia-->33 UA with gross hematuria, UDS pos for benzo (likely given in hospital), otherwise negative Monitor closely on telemetry  Uncontrolled type 2 diabetes mellitus with hyperglycemia, without long-term current use of insulin ([place])- (present on admission) A1c significantly elevated at 11.9 SSI,  Semglee, Accu-Cheks, hypoglycemic protocol Patient will need insulin upon discharge, diabetes coordinator consulted  Gross hematuria  Likely due to [facility], eliquis Eliquis held UA with noted gross hematuria Renal USS ordered Monitor  Paroxysmal A. fib with RVR Noted to convert to sinus rhythm on 04/06/2021, currently rate controlled TSH WNL Echo showed EF of 60 to 65%, no regional wall motion abnormality, left ventricular diastolic parameters are indeterminate S/p Cardizem drip, switched to p.o. Cardizem Hold Eliquis for now Cardiology consulted, appreciate recs  Mixed hyperlipidemia- (present on admission) Continue statins   Essential hypertension- (present on admission) Not on any antihypertensives at home Continue p.o. Cardizem, started on losartan  Hypokalemia/hypomagnesemia Replace as needed  AKI S/p IV fluids Daily BMP   History of right MCA stroke Large right MCA stroke in 2009, with residual left-sided hemiparesis noted L UEs contracted No new focal neurologic deficits noted Fall precautions     DVT prophylaxis: Eliquis on hold Code Status: Full Family Communication: D/W wife at bedside Disposition: Inpatient     Consultants:  Neurology Cardiology  Subjective:  Reported RUE numbess and tingling around fingers earlier in the morning, currently resolved. Noted gross hematuria last night.  Patient denies any other new complaints.    Objective: Vitals:   04/06/21 1931 04/06/21 2352 04/07/21 0810 04/07/21 1204  BP: (!) 140/97 (!) 148/88 (!) 156/90 (!) 164/99  Pulse: 72 65 69 70  Resp: _0 Temp: 98.8 F (37.1 C) 98.7 F (37.1 C) 98.6 F (37 C) 98.5 F (36.9 C)  TempSrc: Oral Oral Oral Oral  SpO2: 94% 96% 98% 99%  Weight:        Intake/Output Summary (Last 24 hours) at 04/07/2021 1505 Last  data filed at 04/07/2021 0702 Gross per 24 hour  Intake 180 ml  Output 100 ml  Net 80 ml   Filed Weights   04/05/21 0919  Weight: 94.8 kg     Examination: General: NAD, noted huge scar on head Cardiovascular: S1, S2 present Respiratory: CTAB Abdomen: Soft, nontender, nondistended, bowel sounds present Musculoskeletal: No bilateral pedal edema noted Skin: Normal Psychiatry: Normal mood  Neurology: Noted left upper extremity contracture      Data Reviewed: I have personally reviewed following labs and imaging studies  CBC: Recent Labs  Lab 04/04/21 1716 04/05/21 0312 04/06/21 0352 04/07/21 0206 04/07/21 0603  WBC 10.5 9.7 7.0 7.6 6.4  NEUTROABS 8.7* 6.1  --   --   --   HGB 15.3 14.8 15.2 14.4 14.0  HCT 45.5 44.8 44.4 43.5 41.9  MCV 87.5 87.8 86.2 86.8 86.6  PLT 203 198 180 177 709    Basic Metabolic Panel: Recent Labs  Lab 04/04/21 1716 04/05/21 0312 04/06/21 0352 04/07/21 0206  NA 133* 137 136 136  K 3.9 3.4* 3.2* 3.0*  CL 95* 103 104 106  CO2 21* 22 20* 20*  GLUCOSE 586* 285* 182* 120*  BUN _0 CREATININE 1.80* 1.54* 1.29* 1.32*  CALCIUM 9.1 9.1 8.3* 8.4*  MG  --  1.9 1.5* 1.9    GFR: Estimated Creatinine Clearance: 70.2 mL/min (A) (by C-G formula based on SCr of 1.32 mg/dL (H)).  Liver Function Tests: Recent Labs  Lab 04/04/21 1716 04/05/21 0312  AST 27 21  ALT 20 17  ALKPHOS 115 96  BILITOT 0.8 0.7  PROT 9.1* 8.3*  ALBUMIN 3.9 3.6    CBG: Recent Labs  Lab 04/06/21 1210 04/06/21 1627 04/06/21 2125 04/07/21 0636 04/07/21 1206  GLUCAP 321* 293* 269* 223* 326*     Recent Results (from the past 240 hour(s))  Resp Panel by RT-PCR (Flu A&B, Covid) Nasopharyngeal Swab     Status: None   Collection Time: 04/04/21  4:37 PM   Specimen: Nasopharyngeal Swab; Nasopharyngeal(NP) swabs in vial transport medium  Result Value Ref Range Status   SARS Coronavirus 2 by RT PCR NEGATIVE NEGATIVE Final    Comment: (NOTE) SARS-CoV-2 target nucleic acids are NOT DETECTED.  The SARS-CoV-2 RNA is generally detectable in upper respiratory specimens during the acute phase of  infection. The lowest concentration of SARS-CoV-2 viral copies this assay can detect is 138 copies/mL. A negative result does not preclude SARS-Cov-2 infection and should not be used as the sole basis for treatment or other patient management decisions. A negative result may occur with  improper specimen collection/handling, submission of specimen other than nasopharyngeal swab, presence of viral mutation(s) within the areas targeted by this assay, and inadequate number of viral copies(<138 copies/mL). A negative result must be combined with clinical observations, patient history, and epidemiological information. The expected result is Negative.  Fact Sheet for Patients:  EntrepreneurPulse.com.au  Fact Sheet for Healthcare Providers:  IncredibleEmployment.be  This test is no t yet approved or cleared by the [place] FDA and  has been authorized for detection and/or diagnosis of SARS-CoV-2 by FDA under an Emergency Use Authorization (EUA). This EUA will remain  in effect (meaning this test can be used) for the duration of the COVID-19 declaration under Section 564(b)(1) of the Act, 21 U.S.C.section 360bbb-3(b)(1), unless the authorization is terminated  or revoked sooner.       Influenza A by PCR NEGATIVE NEGATIVE Final   Influenza B  by PCR NEGATIVE NEGATIVE Final    Comment: (NOTE) The Xpert Xpress SARS-CoV-2/FLU/RSV plus assay is intended as an aid in the diagnosis of influenza from Nasopharyngeal swab specimens and should not be used as a sole basis for treatment. Nasal washings and aspirates are unacceptable for Xpert Xpress SARS-CoV-2/FLU/RSV testing.  Fact Sheet for Patients: EntrepreneurPulse.com.au  Fact Sheet for Healthcare Providers: IncredibleEmployment.be  This test is not yet approved or cleared by the [place] FDA and has been authorized for detection and/or diagnosis of SARS-CoV-2  by FDA under an Emergency Use Authorization (EUA). This EUA will remain in effect (meaning this test can be used) for the duration of the COVID-19 declaration under Section 564(b)(1) of the Act, 21 U.S.C. section 360bbb-3(b)(1), unless the authorization is terminated or revoked.  Performed at [facility], [place] [address], [place], [place] [address]          Radiology Studies: MR BRAIN WO CONTRAST  Result Date: 04/07/2021 CLINICAL DATA:  61 year old male with right upper extremity tingling and numbness. Large chronic right MCA infarct. EXAM: MRI HEAD WITHOUT CONTRAST TECHNIQUE: Multiplanar, multiecho pulse sequences of the brain and surrounding structures were obtained without intravenous contrast. COMPARISON:  Brain MRI 04/05/2021 and earlier head CTs. FINDINGS: Brain: Large area of chronic encephalomalacia in the right hemisphere, affecting the right MCA territory and adjacent watershed areas. Brainstem Wallerian degeneration. Ex vacuo ventricular enlargement. Previous right side craniotomy superimposed. No convincing restricted diffusion to suggest acute infarction. No midline shift, mass effect, evidence of mass lesion, or acute intracranial hemorrhage. Cervicomedullary junction and pituitary are within normal limits. Patchy white matter T2 and FLAIR hyperintensity in the left hemisphere is stable. No new signal abnormality. Vascular: Major intracranial vascular flow voids are stable with absent flow signal in the right ICA siphon. Evidence of some reconstituted flow at the right ICA terminus. Skull and upper cervical spine: Previous right side craniotomy. Otherwise negative. Visualized bone marrow signal is within normal limits. Sinuses/Orbits: Stable, negative. Other: Visible internal auditory structures appear normal. Postoperative changes to the right scalp. IMPRESSION: Large chronic right hemisphere infarct with No acute intracranial abnormality. Electronically Signed   By: Genevie Ann  M.D.   On: 04/07/2021 09:31   ECHOCARDIOGRAM COMPLETE  Result Date: 04/06/2021    ECHOCARDIOGRAM REPORT   Patient Name:   Eddie Conway Date of Exam: 04/06/2021 Medical Rec #:  [id number]      Height:       72.0 in Accession #:    [id number]     Weight:       208.9 lb Date of Birth:  [birth year]      BSA:          2.170 m Patient Age:    38 years       BP:           131/91 mmHg Patient Gender: M              HR:           77 bpm. Exam Location:  Inpatient Procedure: 2D Echo, Cardiac Doppler and Color Doppler Indications:    I48.91 AFIB  History:        Patient has no prior history of Echocardiogram examinations.                 Stroke; Risk Factors:Diabetes and Dyslipidemia.  Sonographer:    Beryle Beams Referring Phys: [id number]  1. Left ventricular ejection fraction, by estimation, is 60  to 65%. The left ventricle has normal function. The left ventricle has no regional wall motion abnormalities. There is mild concentric left ventricular hypertrophy. Left ventricular diastolic parameters are indeterminate.  2. Right ventricular systolic function is normal. The right ventricular size is normal. Tricuspid regurgitation signal is inadequate for assessing PA pressure.  3. The mitral valve is normal in structure. No evidence of mitral valve regurgitation. No evidence of mitral stenosis.  4. The aortic valve is normal in structure. Aortic valve regurgitation is not visualized. No aortic stenosis is present.  5. The inferior vena cava is normal in size with greater than 50% respiratory variability, suggesting right atrial pressure of 3 mmHg. Comparison(s): No prior Echocardiogram. FINDINGS  Left Ventricle: Left ventricular ejection fraction, by estimation, is 60 to 65%. The left ventricle has normal function. The left ventricle has no regional wall motion abnormalities. The left ventricular internal cavity size was normal in size. There is  mild concentric left ventricular hypertrophy. Left  ventricular diastolic parameters are indeterminate. Right Ventricle: The right ventricular size is normal. No increase in right ventricular wall thickness. Right ventricular systolic function is normal. Tricuspid regurgitation signal is inadequate for assessing PA pressure. Left Atrium: Left atrial size was normal in size. Right Atrium: Right atrial size was normal in size. Pericardium: There is no evidence of pericardial effusion. Mitral Valve: The mitral valve is normal in structure. No evidence of mitral valve regurgitation. No evidence of mitral valve stenosis. Tricuspid Valve: The tricuspid valve is normal in structure. Tricuspid valve regurgitation is not demonstrated. No evidence of tricuspid stenosis. Aortic Valve: The aortic valve is normal in structure. Aortic valve regurgitation is not visualized. No aortic stenosis is present. Aortic valve mean gradient measures 4.0 mmHg. Aortic valve peak gradient measures 8.2 mmHg. Aortic valve area, by VTI measures 3.38 cm. Pulmonic Valve: The pulmonic valve was normal in structure. Pulmonic valve regurgitation is not visualized. No evidence of pulmonic stenosis. Aorta: The aortic root is normal in size and structure. Venous: The inferior vena cava is normal in size with greater than 50% respiratory variability, suggesting right atrial pressure of 3 mmHg. IAS/Shunts: No atrial level shunt detected by color flow Doppler.  LEFT VENTRICLE PLAX 2D LVIDd:         4.60 cm     Diastology LVIDs:         2.50 cm     LV e' medial:    5.78 cm/s LV PW:         1.30 cm     LV E/e' medial:  7.0 LV IVS:        1.20 cm     LV e' lateral:   8.77 cm/s LVOT diam:     2.20 cm     LV E/e' lateral: 4.6 LV SV:         77 LV SV Index:   35 LVOT Area:     3.80 cm  LV Volumes (MOD) LV vol d, MOD A4C: 53.8 ml LV vol s, MOD A4C: 12.1 ml LV SV MOD A4C:     53.8 ml RIGHT VENTRICLE RV S prime:     22.80 cm/s TAPSE (M-mode): 2.7 cm LEFT ATRIUM             Index        RIGHT ATRIUM           Index  LA diam:        3.40 cm 1.57 cm/m   RA Area:  11.60 cm LA Vol (A2C):   57.0 ml 26.26 ml/m  RA Volume:   21.20 ml  9.77 ml/m LA Vol (A4C):   61.5 ml 28.34 ml/m LA Biplane Vol: 60.6 ml 27.92 ml/m  AORTIC VALVE                    PULMONIC VALVE AV Area (Vmax):    3.32 cm     PV Vmax:       0.76 m/s AV Area (Vmean):   3.32 cm     PV Vmean:      44.300 cm/s AV Area (VTI):     3.38 cm     PV VTI:        0.105 m AV Vmax:           143.00 cm/s  PV Peak grad:  2.3 mmHg AV Vmean:          90.900 cm/s  PV Mean grad:  1.0 mmHg AV VTI:            0.227 m AV Peak Grad:      8.2 mmHg AV Mean Grad:      4.0 mmHg LVOT Vmax:         125.00 cm/s LVOT Vmean:        79.300 cm/s LVOT VTI:          0.202 m LVOT/AV VTI ratio: 0.89  AORTA Ao Asc diam: 3.50 cm MITRAL VALVE MV Area (PHT): 2.50 cm    SHUNTS MV Decel Time: 304 msec    Systemic VTI:  0.20 m MV E velocity: 40.70 cm/s  Systemic Diam: 2.20 cm MV A velocity: 60.90 cm/s MV E/A ratio:  0.67 Kardie Tobb DO Electronically signed by Berniece Salines DO Signature Date/Time: 04/06/2021/1:05:18 PM    Final         Scheduled Meds:  atorvastatin  40 mg Oral Daily   [START ON 04/08/2021] diltiazem  180 mg Oral Daily   diltiazem  60 mg Oral Q8H   ferrous sulfate  325 mg Oral Q breakfast   insulin aspart  0-15 Units Subcutaneous TID AC & HS   insulin aspart  3 Units Subcutaneous TID WC   [START ON 04/08/2021] insulin glargine-yfgn  20 Units Subcutaneous Daily   insulin starter kit- pen needles  1 kit Other Once   living well with diabetes book   Does not apply Once   losartan  25 mg Oral Daily   magnesium oxide  400 mg Oral Daily   potassium chloride  40 mEq Oral BID   Continuous Infusions:     LOS: 2 days       Alma Friendly, MD [facility]   To contact the attending provider between 7A-7P or the covering provider during after hours 7P-7A, please log into the web site [email or web address] and access using universal [place] password for that web  site. If you do not have the password, please call the hospital operator.  04/07/2021, 3:05 PM   Patient ID: Eddie Conway, male   DOB: 1960-04-04, 61 y.o.   MRN: [id number]

## 2021-04-08 ENCOUNTER — Inpatient Hospital Stay (HOSPITAL_COMMUNITY): Payer: No Typology Code available for payment source

## 2021-04-08 DIAGNOSIS — Z8673 Personal history of transient ischemic attack (TIA), and cerebral infarction without residual deficits: Secondary | ICD-10-CM | POA: Diagnosis not present

## 2021-04-08 DIAGNOSIS — E782 Mixed hyperlipidemia: Secondary | ICD-10-CM | POA: Diagnosis not present

## 2021-04-08 DIAGNOSIS — G9341 Metabolic encephalopathy: Secondary | ICD-10-CM | POA: Diagnosis not present

## 2021-04-08 DIAGNOSIS — I10 Essential (primary) hypertension: Secondary | ICD-10-CM | POA: Diagnosis not present

## 2021-04-08 LAB — CBC WITH DIFFERENTIAL/PLATELET
Abs Immature Granulocytes: 0.03 10*3/uL (ref 0.00–0.07)
Basophils Absolute: 0 10*3/uL (ref 0.0–0.1)
Basophils Relative: 0 %
Eosinophils Absolute: 0.5 10*3/uL (ref 0.0–0.5)
Eosinophils Relative: 5 %
HCT: 44.4 % (ref 39.0–52.0)
Hemoglobin: 14.3 g/dL (ref 13.0–17.0)
Immature Granulocytes: 0 %
Lymphocytes Relative: 38 %
Lymphs Abs: 3.5 10*3/uL (ref 0.7–4.0)
MCH: 28.2 pg (ref 26.0–34.0)
MCHC: 32.2 g/dL (ref 30.0–36.0)
MCV: 87.6 fL (ref 80.0–100.0)
Monocytes Absolute: 1.2 10*3/uL — ABNORMAL HIGH (ref 0.1–1.0)
Monocytes Relative: 13 %
Neutro Abs: 3.8 10*3/uL (ref 1.7–7.7)
Neutrophils Relative %: 44 %
Platelets: 203 10*3/uL (ref 150–400)
RBC: 5.07 MIL/uL (ref 4.22–5.81)
RDW: 14.4 % (ref 11.5–15.5)
WBC: 9 10*3/uL (ref 4.0–10.5)
nRBC: 0 % (ref 0.0–0.2)

## 2021-04-08 LAB — GLUCOSE, CAPILLARY
Glucose-Capillary: 231 mg/dL — ABNORMAL HIGH (ref 70–99)
Glucose-Capillary: 279 mg/dL — ABNORMAL HIGH (ref 70–99)
Glucose-Capillary: 227 mg/dL — ABNORMAL HIGH (ref 70–99)
Glucose-Capillary: 211 mg/dL — ABNORMAL HIGH (ref 70–99)

## 2021-04-08 LAB — BASIC METABOLIC PANEL
Anion gap: 12 (ref 5–15)
BUN: 11 mg/dL (ref 8–23)
CO2: 18 mmol/L — ABNORMAL LOW (ref 22–32)
Calcium: 8.8 mg/dL — ABNORMAL LOW (ref 8.9–10.3)
Chloride: 108 mmol/L (ref 98–111)
Creatinine, Ser: 1.46 mg/dL — ABNORMAL HIGH (ref 0.61–1.24)
GFR, Estimated: 54 mL/min — ABNORMAL LOW (ref >60)
Glucose, Bld: 76 mg/dL (ref 70–99)
Potassium: 3.3 mmol/L — ABNORMAL LOW (ref 3.5–5.1)
Sodium: 138 mmol/L (ref 135–145)

## 2021-04-08 MED ORDER — INSULIN ASPART 100 UNIT/ML IJ SOLN
6.0000 [IU] | Freq: Three times a day (TID) | INTRAMUSCULAR | Status: DC
  Administered 2021-04-08 – 2021-04-09 (×3): 6 [IU] via SUBCUTANEOUS

## 2021-04-08 MED ORDER — APIXABAN 5 MG PO TABS
5.0000 mg | ORAL_TABLET | Freq: Two times a day (BID) | ORAL | Status: DC
  Administered 2021-04-08 – 2021-04-09 (×2): 5 mg via ORAL
  Filled 2021-04-08 (×2): qty 1

## 2021-04-08 NOTE — Progress Notes (Signed)
Inpatient Diabetes Program Recommendations  AACE/ADA: New Consensus Statement on Inpatient Glycemic Control   Target Ranges:  Prepandial:   less than 140 mg/dL      Peak postprandial:   less than 180 mg/dL (1-2 hours)      Critically ill patients:  140 - 180 mg/dL    Latest Reference Range & Units 04/07/21 06:36 04/07/21 12:06 04/07/21 17:51 04/07/21 21:20 04/08/21 06:10  Glucose-Capillary 70 - 99 mg/dL 211 (H) 941 (H) 740 (H) 324 (H) 231 (H)   Review of Glycemic Control  Diabetes history: DM2 Outpatient Diabetes medications: Metformin 500 mg BID Current orders for Inpatient glycemic control: Semglee 20 units daily, Novolog 3 units TID with meals, Novolog 0-15 units AC&HS  Inpatient Diabetes Program Recommendations:    Insulin: Noted Semglee was increased from 16 to 20 units daily (to start today). Please consider increasing meal coverage to Novolog 6 units TID with meals.  Thanks, Orlando Penner, RN, MSN, CDE Diabetes Coordinator Inpatient Diabetes Program [phone] (Team Pager from 8am to 5pm)'

## 2021-04-08 NOTE — Plan of Care (Signed)
°  Problem: Safety: Goal: Non-violent Restraint(s) Outcome: Progressing   Problem: Education: Goal: Knowledge of disease or condition will improve Outcome: Progressing   Problem: Ischemic Stroke/TIA Tissue Perfusion: Goal: Complications of ischemic stroke/TIA will be minimized Outcome: Progressing

## 2021-04-08 NOTE — Progress Notes (Signed)
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  Patient was noted by his wife to have slurred speech, confusion, she brought him to EMS for further evaluation, he was seen by neurology with concern of partial seizures, EEG with no evidence of seizures, and his CBG was significantly elevated, blood glucose was 586, and A1c of 11.9. Patient admitted for further management.     Assessment & Plan:   Principal Problem:   Acute metabolic encephalopathy Active Problems:   Mixed hyperlipidemia   Uncontrolled type 2 diabetes mellitus with hyperglycemia, without long-term current use of insulin (HCC)   Essential hypertension   History of right MCA stroke   Altered mental status   Acute metabolic encephalopathy- Resolved ??TIA Patient presenting with 24-hour history of nonsensical speech, agitation and odd behavior According to wife, patient's Keppra that he has been taking prophylactically since his right MCA stroke in 2009 was recently discontinued EEG with no seizure-like activity MRI brain X 2 negative for acute infarct Chest x-ray unremarkable Vitamin B12-->1002, ammonia-->33 UA with gross hematuria, UDS pos for benzo (likely given in hospital), otherwise negative Monitor closely on telemetry  Uncontrolled type 2 diabetes mellitus with hyperglycemia, without long-term current use of insulin ([place])- (present on admission) A1c significantly elevated at 11.9 SSI, Semglee, novolog TID, Accu-Cheks, hypoglycemic protocol Patient will need insulin upon  discharge, diabetes coordinator consulted  Gross hematuria (intermittent, chronic history) Resolving CT renal stone showed no hydronephrosis, noted bilateral renal stones, largest measuring 10 mm in the left kidney, 5 mm and 10 mm calculus in the urinary bladder, overall no obstruction (appears to be all chronic) Restart Eliquis Monitor  Paroxysmal A. fib with RVR Noted to convert to sinus rhythm on 04/06/2021, currently rate controlled TSH WNL Echo showed EF of 60 to 65%, no regional wall motion abnormality, left ventricular diastolic parameters are indeterminate S/p Cardizem drip, switched to p.o. Cardizem Restart Eliquis Cardiology consulted, appreciate recs, signed off, outpatient follow-up  Mixed hyperlipidemia- (present on admission) Continue statins   Essential hypertension- (present on admission) Not on any antihypertensives at home Continue p.o. Cardizem, started on losartan  Hypokalemia/hypomagnesemia Replace as needed  AKI S/p IV fluids Daily BMP   History of right MCA stroke Large right MCA stroke in 2009, with residual left-sided hemiparesis noted L UEs contracted No new focal neurologic deficits noted Fall precautions     DVT prophylaxis: Eliquis Code Status: Full Family Communication: D/W wife at bedside Disposition: Inpatient     Consultants:  Neurology Cardiology  Subjective:  Patient denies any new complaints.  Hematuria has resolved    Objective: Vitals:   04/08/21 0352 04/08/21 0808 04/08/21 1102 04/08/21 1543  BP: (!) 136/100 (!) 138/106 (!) 158/102 (!) 147/98  Pulse: 64 65 99 66  Resp: _0 Temp: 98.1 F (36.7 C) 98.6 F (37 C) 97.9 F (36.6 C) 98.6 F (37 C)  TempSrc: Oral Oral Oral Oral  SpO2: 98% 100% 99% 100%  Weight:       No intake or output data in the 24 hours ending 04/08/21  [place]   04/05/21 0919  Weight: 94.8 kg    Examination: General: NAD, noted huge scar on head Cardiovascular: S1, S2  present Respiratory: CTAB Abdomen: Soft, nontender, nondistended, bowel sounds present Musculoskeletal: No bilateral pedal edema noted Skin: Normal Psychiatry: Normal mood  Neurology: Noted left upper extremity contracture      Data Reviewed: I have personally reviewed following labs and imaging studies  CBC: Recent Labs  Lab 04/04/21 1716 04/05/21 0312 04/06/21 0352 04/07/21 0206 04/07/21 0603 04/08/21 0159  WBC 10.5 9.7 7.0 7.6 6.4 9.0  NEUTROABS 8.7* 6.1  --   --   --  3.8  HGB 15.3 14.8 15.2 14.4 14.0 14.3  HCT 45.5 44.8 44.4 43.5 41.9 44.4  MCV 87.5 87.8 86.2 86.8 86.6 87.6  PLT 203 198 180 177 162 620    Basic Metabolic Panel: Recent Labs  Lab 04/04/21 1716 04/05/21 0312 04/06/21 0352 04/07/21 0206 04/08/21 0159  NA 133* 137 136 136 138  K 3.9 3.4* 3.2* 3.0* 3.3*  CL 95* 103 104 106 108  CO2 21* 22 20* 20* 18*  GLUCOSE 586* 285* 182* 120* 76  BUN _0 CREATININE 1.80* 1.54* 1.29* 1.32* 1.46*  CALCIUM 9.1 9.1 8.3* 8.4* 8.8*  MG  --  1.9 1.5* 1.9  --     GFR: Estimated Creatinine Clearance: 63.5 mL/min (A) (by C-G formula based on SCr of 1.46 mg/dL (H)).  Liver Function Tests: Recent Labs  Lab 04/04/21 1716 04/05/21 0312  AST 27 21  ALT 20 17  ALKPHOS 115 96  BILITOT 0.8 0.7  PROT 9.1* 8.3*  ALBUMIN 3.9 3.6    CBG: Recent Labs  Lab 04/07/21 1206 04/07/21 1751 04/07/21 2120 04/08/21 0610 04/08/21 1105  GLUCAP 326* 348* 324* 231* 279*     Recent Results (from the past 240 hour(s))  Resp Panel by RT-PCR (Flu A&B, Covid) Nasopharyngeal Swab     Status: None   Collection Time: 04/04/21  4:37 PM   Specimen: Nasopharyngeal Swab; Nasopharyngeal(NP) swabs in vial transport medium  Result Value Ref Range Status   SARS Coronavirus 2 by RT PCR NEGATIVE NEGATIVE Final    Comment: (NOTE) SARS-CoV-2 target nucleic acids are NOT DETECTED.  The SARS-CoV-2 RNA is generally detectable in upper respiratory specimens during the acute  phase of infection. The lowest concentration of SARS-CoV-2 viral copies this assay can detect is 138 copies/mL. A negative result does not preclude SARS-Cov-2 infection and should not be used as the sole basis for treatment or other patient management decisions. A negative result may occur with  improper specimen collection/handling, submission of specimen other than nasopharyngeal swab, presence of viral mutation(s) within the areas targeted by this assay, and inadequate number of viral copies(<138 copies/mL). A negative result must be combined with clinical observations, patient history, and epidemiological information. The expected result is Negative.  Fact Sheet for Patients:  EntrepreneurPulse.com.au  Fact Sheet for Healthcare Providers:  IncredibleEmployment.be  This test is no t yet approved or cleared by the [place] FDA and  has been authorized for detection and/or diagnosis of SARS-CoV-2 by FDA under an Emergency Use Authorization (EUA). This EUA will remain  in effect (meaning this test can be used) for the duration of the COVID-19 declaration under Section 564(b)(1) of the Act, 21 U.S.C.section 360bbb-3(b)(1), unless the authorization is terminated  or revoked sooner.       Influenza A by PCR NEGATIVE NEGATIVE Final   Influenza B  by PCR NEGATIVE NEGATIVE Final    Comment: (NOTE) The Xpert Xpress SARS-CoV-2/FLU/RSV plus assay is intended as an aid in the diagnosis of influenza from Nasopharyngeal swab specimens and should not be used as a sole basis for treatment. Nasal washings and aspirates are unacceptable for Xpert Xpress SARS-CoV-2/FLU/RSV testing.  Fact Sheet for Patients: EntrepreneurPulse.com.au  Fact Sheet for Healthcare Providers: IncredibleEmployment.be  This test is not yet approved or cleared by the [place] FDA and has been authorized for detection and/or diagnosis of  SARS-CoV-2 by FDA under an Emergency Use Authorization (EUA). This EUA will remain in effect (meaning this test can be used) for the duration of the COVID-19 declaration under Section 564(b)(1) of the Act, 21 U.S.C. section 360bbb-3(b)(1), unless the authorization is terminated or revoked.  Performed at [facility], [place] [address], [place],  [address]          Radiology Studies: MR BRAIN WO CONTRAST  Result Date: 04/07/2021 CLINICAL DATA:  61 year old male with right upper extremity tingling and numbness. Large chronic right MCA infarct. EXAM: MRI HEAD WITHOUT CONTRAST TECHNIQUE: Multiplanar, multiecho pulse sequences of the brain and surrounding structures were obtained without intravenous contrast. COMPARISON:  Brain MRI 04/05/2021 and earlier head CTs. FINDINGS: Brain: Large area of chronic encephalomalacia in the right hemisphere, affecting the right MCA territory and adjacent watershed areas. Brainstem Wallerian degeneration. Ex vacuo ventricular enlargement. Previous right side craniotomy superimposed. No convincing restricted diffusion to suggest acute infarction. No midline shift, mass effect, evidence of mass lesion, or acute intracranial hemorrhage. Cervicomedullary junction and pituitary are within normal limits. Patchy white matter T2 and FLAIR hyperintensity in the left hemisphere is stable. No new signal abnormality. Vascular: Major intracranial vascular flow voids are stable with absent flow signal in the right ICA siphon. Evidence of some reconstituted flow at the right ICA terminus. Skull and upper cervical spine: Previous right side craniotomy. Otherwise negative. Visualized bone marrow signal is within normal limits. Sinuses/Orbits: Stable, negative. Other: Visible internal auditory structures appear normal. Postoperative changes to the right scalp. IMPRESSION: Large chronic right hemisphere infarct with No acute intracranial abnormality. Electronically Signed   By:  Genevie Ann M.D.   On: 04/07/2021 09:31   US RENAL  Result Date: 04/07/2021 CLINICAL DATA:  Hematuria. EXAM: RENAL / URINARY TRACT ULTRASOUND COMPLETE COMPARISON:  None. FINDINGS: Right Kidney: Renal measurements: 10.8 x 5.3 x 5.2 cm = volume: 155 mL. Echogenicity within normal limits. No mass or hydronephrosis visualized. Left Kidney: Renal measurements: 10.9 x 5.9 x 5.9 cm = volume: 200 mL. Echogenicity within normal limits. 10 mm nonobstructive calculus is noted in midpole. No mass or hydronephrosis visualized. Bladder: Left ureteral jet is noted. Right ureteral jet is not visualized. Multiple echogenic foci are noted in the pendant portion of urinary bladder, the largest measuring 1.2 cm. This is concerning for bladder calculi. Possible calculus seen in dilated distal right ureter. Other: None. IMPRESSION: Probable nonobstructive left renal calculus. No other renal abnormality is noted. Possible bladder calculi. Possible calculus seen in dilated distal right ureter. CT urogram is recommended for further evaluation. Electronically Signed   By: Marijo Conception M.D.   On: 04/07/2021 16:30   CT RENAL STONE STUDY  Result Date: 04/08/2021 CLINICAL DATA:  Nephrolithiasis EXAM: CT ABDOMEN AND PELVIS WITHOUT CONTRAST TECHNIQUE: Multidetector CT imaging of the abdomen and pelvis was performed following the standard protocol without IV contrast. RADIATION DOSE REDUCTION: This exam was performed according to the departmental dose-optimization program which  includes automated exposure control, adjustment of the mA and/or kV according to patient size and/or use of iterative reconstruction technique. COMPARISON:  None. FINDINGS: Lower chest: Small linear densities in the lower lung fields may suggest scarring or subsegmental atelectasis. There is elevation of left hemidiaphragm. Scattered coronary artery calcifications are seen. Hepatobiliary: No focal abnormality is seen in the liver. Surgical clips are seen in gallbladder  fossa. Pancreas: No focal abnormality is seen. Spleen: Unremarkable. Adrenals/Urinary Tract: There is mild hyperplasia of left adrenal. There is no hydronephrosis. There is 10 mm calculus in the mid to lower portion of left kidney. There are 2 tiny calculi in the lower pole of left kidney each measuring less than 3 mm. There are 2 small calcifications in the lower pole of right kidney larger 1 measuring 5.5 mm. There are no calcific densities in the courses of the ureters. There is a 5 mm calculus in the dependent portion of urinary bladder. There is a bladder diverticulum in the right posterolateral margin of the urinary bladder measuring approximally 2.6 cm. There is 11 mm calculus in the bladder diverticulum on the right side. In image 78 of series 3, there is 14 mm smooth marginated fluid density lesion in the left side of pelvis with no demonstrable communication to the bladder. This could possibly suggest a necrotic lymph node. Stomach/Bowel: Small hiatal hernia is seen. Small bowel loops are not dilated. Appendix is not dilated. There is no significant focal wall thickening in the colon. There is gaseous distention of sigmoid colon. Vascular/Lymphatic: There are scattered arterial calcifications. Inferior vena caval filter is seen. Reproductive: Coarse calcifications are seen in the prostate. Prostate is smaller than usual in size. Other: There is no ascites or pneumoperitoneum. Small umbilical hernia containing fat is seen. Small bilateral inguinal hernias containing fat are noted, larger on the right side. Musculoskeletal: Degenerative changes are noted in the lumbar spine IMPRESSION: There is no hydronephrosis. Bilateral renal stones largest measuring 10 mm in the left kidney. There is 5 mm calculus in the dependent portion of urinary bladder suggesting bladder calculus. There is 10 mm calculus within a diverticulum in the right posterolateral margin of urinary bladder. There is no evidence of intestinal  obstruction or pneumoperitoneum. Appendix is not dilated. Small hiatal hernia. Other findings as described in the body of the report. Electronically Signed   By: Elmer Picker M.D.   On: 04/08/2021 15:22        Scheduled Meds:  apixaban  5 mg Oral BID   atorvastatin  40 mg Oral Daily   diltiazem  180 mg Oral Daily   ferrous sulfate  325 mg Oral Q breakfast   insulin aspart  0-15 Units Subcutaneous TID AC & HS   insulin aspart  6 Units Subcutaneous TID WC   insulin glargine-yfgn  20 Units Subcutaneous Daily   insulin starter kit- pen needles  1 kit Other Once   losartan  25 mg Oral Daily   potassium chloride  40 mEq Oral BID   Continuous Infusions:     LOS: 3 days       Alma Friendly, MD [facility]   To contact the attending provider between 7A-7P or the covering provider during after hours 7P-7A, please log into the web site [email or web address] and access using universal [place] password for that web site. If you do not have the password, please call the hospital operator.  04/08/2021, 4:07 PM   Patient ID: Eddie Conway, male   DOB:  04-26-1960, 61 y.o.   MRN: [id number]

## 2021-04-08 NOTE — TOC Progression Note (Signed)
Transition of Care [facility]) - Progression Note    Patient Details  Name: Eddie Conway MRN: [id number] Date of Birth: 12/24/1960  Transition of Care [facility]) CM/SW Contact  Tom-Johnson, Hershal Coria, RN Phone Number: 04/08/2021, 4:23 PM  Clinical Narrative:    CM called Leavy Cella [phone] ex [phone]) and Alger Simons [phone] ex [phone]) and secure message left to return call to know where to fax order for outpatient PT and also to schedule transportation for appointments. Awaiting their call. CM will continue to follow with needs. CM received call from [place] and information given to fax order to [phone] to [facility]. Patient had been to outpatient therapy at [facility] [place] on [address] and would like to resume. Order faxed with successful notice noted. CM will continue to follow with needs.   Expected Discharge Plan: OP Rehab Barriers to Discharge: Continued Medical Work up  Expected Discharge Plan and Services Expected Discharge Plan: OP Rehab   Discharge Planning Services: CM Consult   Living arrangements for the past 2 months: Apartment                                       Social Determinants of Health (SDOH) Interventions    Readmission Risk Interventions No flowsheet data found.

## 2021-04-08 NOTE — TOC Transition Note (Deleted)
Transition of Care [facility]) - CM/SW Discharge Note   Patient Details  Name: Ellias Nodarse MRN: [id number] Date of Birth: 1960-08-04  [facility]) CM/SW Contact:  Tom-Johnson, Renea Ee, RN Phone Number: 04/08/2021, 9:12 AM   Clinical Narrative:    CM called Delana Meyer [phone] ex [phone]) and [place] Lighter [phone] ex [phone]) and secure message left to return call to know where to fax order for outpatient PT and also to schedule transportation for appointments. Awaiting their call. CM will continue to follow with needs.     Barriers to Discharge: Continued Medical Work up   Patient Goals and CMS Choice     Choice offered to / list presented to : Patient, Spouse  Discharge Placement                       Discharge Plan and Services   Discharge Planning Services: CM Consult                                 Social Determinants of Health (SDOH) Interventions     Readmission Risk Interventions No flowsheet data found.

## 2021-04-09 ENCOUNTER — Other Ambulatory Visit: Payer: Self-pay

## 2021-04-09 DIAGNOSIS — Z8673 Personal history of transient ischemic attack (TIA), and cerebral infarction without residual deficits: Secondary | ICD-10-CM | POA: Diagnosis not present

## 2021-04-09 DIAGNOSIS — E782 Mixed hyperlipidemia: Secondary | ICD-10-CM | POA: Diagnosis not present

## 2021-04-09 DIAGNOSIS — I10 Essential (primary) hypertension: Secondary | ICD-10-CM | POA: Diagnosis not present

## 2021-04-09 DIAGNOSIS — G9341 Metabolic encephalopathy: Secondary | ICD-10-CM | POA: Diagnosis not present

## 2021-04-09 LAB — CBC WITH DIFFERENTIAL/PLATELET
Abs Immature Granulocytes: 0.02 10*3/uL (ref 0.00–0.07)
Basophils Absolute: 0 10*3/uL (ref 0.0–0.1)
Basophils Relative: 1 %
Eosinophils Absolute: 0.4 10*3/uL (ref 0.0–0.5)
Eosinophils Relative: 6 %
HCT: 42.6 % (ref 39.0–52.0)
Hemoglobin: 14.3 g/dL (ref 13.0–17.0)
Immature Granulocytes: 0 %
Lymphocytes Relative: 35 %
Lymphs Abs: 2.3 10*3/uL (ref 0.7–4.0)
MCH: 29.5 pg (ref 26.0–34.0)
MCHC: 33.6 g/dL (ref 30.0–36.0)
MCV: 88 fL (ref 80.0–100.0)
Monocytes Absolute: 0.7 10*3/uL (ref 0.1–1.0)
Monocytes Relative: 11 %
Neutro Abs: 3.1 10*3/uL (ref 1.7–7.7)
Neutrophils Relative %: 47 %
Platelets: 185 10*3/uL (ref 150–400)
RBC: 4.84 MIL/uL (ref 4.22–5.81)
RDW: 14.4 % (ref 11.5–15.5)
WBC: 6.6 10*3/uL (ref 4.0–10.5)
nRBC: 0 % (ref 0.0–0.2)

## 2021-04-09 LAB — BASIC METABOLIC PANEL
Anion gap: 11 (ref 5–15)
BUN: 13 mg/dL (ref 8–23)
CO2: 19 mmol/L — ABNORMAL LOW (ref 22–32)
Calcium: 9 mg/dL (ref 8.9–10.3)
Chloride: 107 mmol/L (ref 98–111)
Creatinine, Ser: 1.47 mg/dL — ABNORMAL HIGH (ref 0.61–1.24)
GFR, Estimated: 54 mL/min — ABNORMAL LOW (ref >60)
Glucose, Bld: 127 mg/dL — ABNORMAL HIGH (ref 70–99)
Potassium: 3.9 mmol/L (ref 3.5–5.1)
Sodium: 137 mmol/L (ref 135–145)

## 2021-04-09 LAB — GLUCOSE, CAPILLARY
Glucose-Capillary: 172 mg/dL — ABNORMAL HIGH (ref 70–99)
Glucose-Capillary: 248 mg/dL — ABNORMAL HIGH (ref 70–99)

## 2021-04-09 LAB — MAGNESIUM: Magnesium: 1.6 mg/dL — ABNORMAL LOW (ref 1.7–2.4)

## 2021-04-09 MED ORDER — APIXABAN 5 MG PO TABS
5.0000 mg | ORAL_TABLET | Freq: Two times a day (BID) | ORAL | 0 refills | Status: DC
Start: 2021-04-09 — End: 2022-05-25

## 2021-04-09 MED ORDER — DILTIAZEM HCL ER COATED BEADS 180 MG PO CP24: 180.0000 mg | ORAL_CAPSULE | Freq: Every day | ORAL | 0 refills | Status: DC

## 2021-04-09 MED ORDER — INSULIN ASPART 100 UNIT/ML FLEXPEN: 6.0000 [IU] | PEN_INJECTOR | Freq: Three times a day (TID) | SUBCUTANEOUS | 0 refills | Status: DC

## 2021-04-09 MED ORDER — INSULIN GLARGINE 100 UNIT/ML SOLOSTAR PEN: 20.0000 [IU] | PEN_INJECTOR | Freq: Every day | SUBCUTANEOUS | 0 refills | Status: DC

## 2021-04-09 MED ORDER — LOSARTAN POTASSIUM 25 MG PO TABS: 25.0000 mg | ORAL_TABLET | Freq: Every day | ORAL | 0 refills | Status: DC

## 2021-04-09 MED ORDER — BLOOD GLUCOSE MONITOR KIT: PACK | 0 refills | Status: AC

## 2021-04-09 NOTE — Discharge Summary (Signed)
Physician Discharge Summary   Patient: Eddie Conway MRN: [id number] DOB: 07-26-1960  Admit date:     04/04/2021  Discharge date: 04/09/21  Discharge Physician: Alma Friendly   PCP: [facility]   Recommendations at discharge:   PCP in 1 week Follow-up with cardiology as scheduled   Discharge Diagnoses: Principal Problem:   Acute metabolic encephalopathy Active Problems:   Mixed hyperlipidemia   Uncontrolled type 2 diabetes mellitus with hyperglycemia, without long-term current use of insulin ([place])   Essential hypertension   History of right MCA stroke   Altered mental status    Hospital Course: 61 year old male with past medical history of hyperlipidemia, noninsulin dependent diabetes mellitus type 2, 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  Patient was noted by his wife to have slurred speech, confusion, she brought him to EMS for further evaluation, he was seen by neurology with concern of partial seizures, EEG with no evidence of seizures, and his CBG was significantly elevated, blood glucose was 586, and A1c of 11.9. Patient admitted for further management.   Today, pt denies any new complaints, denies any worsening weakness, palpitations, chest pain, shortness of breath, abdominal pain, nausea/vomiting, fever/chills.  Patient to follow-up with PCP and cardiology as scheduled.  Patient will need to fill his medication over this weekend with VS and printed scripts given to patient for getting his medication at the [place].   Assessment and Plan: Acute metabolic encephalopathy- Resolved ??TIA Patient presenting with 24-hour history of nonsensical speech, agitation and odd behavior According to wife, patient's Keppra that he has been taking prophylactically since his right MCA stroke in 2009 was recently discontinued EEG with  no seizure-like activity MRI brain X 2 negative for acute infarct Chest x-ray unremarkable Vitamin B12-->1002, ammonia-->33 UDS pos for benzo (likely given in hospital), otherwise negative Monitor closely on telemetry   Uncontrolled type 2 diabetes mellitus with hyperglycemia, without long-term current use of insulin ([place])- (present on admission) A1c significantly elevated at 11.9 D/c on lantus 20 units nightly, NovoLog 6 units 3 times daily with meals Diabetes coordinator consulted, appreciate recs   Gross hematuria (intermittent, chronic history) Resolved CT renal stone showed no hydronephrosis, noted bilateral renal stones, largest measuring 10 mm in the left kidney, 5 mm and 10 mm calculus in the urinary bladder, overall no obstruction (appears to be all chronic) Continue Eliquis Follow-up with outpatient urology as well as PCP for further management   Paroxysmal A. fib with RVR Noted to convert to sinus rhythm on 04/06/2021, currently rate controlled TSH WNL Echo showed EF of 60 to 65%, no regional wall motion abnormality, left ventricular diastolic parameters are indeterminate S/p Cardizem drip, switched to p.o. Cardizem, continue upon discharge Continue Eliquis Outpatient cardiology follow-up   Mixed hyperlipidemia Continue statins   Essential hypertension Not on any antihypertensives at home Continue p.o. Cardizem, losartan   Hypokalemia/hypomagnesemia Replaced as needed   AKI Improved S/p IV fluids   History of right MCA stroke Large right MCA stroke in 2009, with residual left-sided hemiparesis noted L UEs contracted No new focal neurologic deficits noted     Consultants: Neurology, cardiology Procedures performed: None Disposition: Home Diet recommendation:  Cardiac and Carb modified diet  DISCHARGE MEDICATION: Allergies as of 04/09/2021   No Known Allergies      Medication List     STOP taking these medications  metFORMIN 500 MG  tablet Commonly known as: GLUCOPHAGE       TAKE these medications    acetaminophen 500 MG tablet Commonly known as: TYLENOL Take 1,000 mg by mouth every 6 (six) hours as needed for moderate pain.   apixaban 5 MG Tabs tablet Commonly known as: ELIQUIS Take 1 tablet (5 mg total) by mouth 2 (two) times daily.   atorvastatin 40 MG tablet Commonly known as: LIPITOR Take 40 mg by mouth daily.   blood glucose meter kit and supplies Kit Dispense based on patient and insurance preference. Use up to four times daily as directed.   cholecalciferol 25 MCG (1000 UNIT) tablet Commonly known as: VITAMIN D3 Take 1,000 Units by mouth daily.   diltiazem 180 MG 24 hr capsule Commonly known as: CARDIZEM CD Take 1 capsule (180 mg total) by mouth daily. Start taking on: April 10, 2021   ferrous sulfate 325 (65 FE) MG tablet Take 325 mg by mouth daily with breakfast.   insulin aspart 100 UNIT/ML FlexPen Commonly known as: NOVOLOG Inject 6 Units into the skin 3 (three) times daily with meals.   insulin glargine 100 UNIT/ML Solostar Pen Commonly known as: LANTUS Inject 20 Units into the skin at bedtime.   levETIRAcetam 750 MG tablet Commonly known as: KEPPRA Take 750 mg by mouth 2 (two) times daily.   losartan 25 MG tablet Commonly known as: COZAAR Take 1 tablet (25 mg total) by mouth daily. Start taking on: April 10, 2021   magnesium oxide 400 MG tablet Commonly known as: MAG-OX Take 400 mg by mouth daily.   potassium chloride 20 MEQ/15ML (10%) Soln Take 20 mEq by mouth See admin instructions. Take 15 ml by mouth 3 times a week ( Monday, Wednesday and Friday , must dilute as instructed)   topiramate 100 MG tablet Commonly known as: TOPAMAX Take 100 mg by mouth in the morning, at noon, and at bedtime.   vitamin B-12 500 MCG tablet Commonly known as: CYANOCOBALAMIN Take 1,000 mcg by mouth daily.        Follow-up Information     Imogene Burn, PA-C Follow up.    Specialty: Cardiology Why: Follow-up with Cardiology scheduled for 04/27/2021 at 10:45am. Please arrive 15 minutes early for check-in. If this day/time does not work for you, please call our office to reschedule. Contact information: [address] [place] [address] [phone]         [facility]  Follow up.   Why: Call to schedule appointment. Contact information: [address] [address] [phone]        Clinic, [place] Schedule an appointment as soon as possible for a visit in 1 week(s).   Contact information: [place] [address] [phone]         Freada Bergeron, MD .   Specialties: Cardiology, Radiology Contact information: [address] [address] [phone]                 Discharge Exam: Danley Danker Weights   04/05/21 0919  Weight: 94.8 kg   General: NAD  Cardiovascular: S1, S2 present Respiratory: CTAB Abdomen: Soft, nontender, nondistended, bowel sounds present Musculoskeletal: No bilateral pedal edema noted Skin: Normal Psychiatry: Normal mood   Condition at discharge: stable  The results of significant diagnostics from this hospitalization (including imaging, microbiology, ancillary and laboratory) are listed below for reference.   Imaging Studies:  CT Head Wo Contrast  Result Date: 04/04/2021 CLINICAL DATA:  Altered mental status.  History of stroke. EXAM: CT HEAD WITHOUT CONTRAST TECHNIQUE: Contiguous axial images were obtained from the base of the skull through the vertex without intravenous contrast. RADIATION DOSE REDUCTION: This exam was performed according to the departmental dose-optimization program which includes automated exposure control, adjustment of the mA and/or kV according to patient size and/or use of iterative reconstruction technique. COMPARISON:  02/26/2020 FINDINGS: Brain: Chronic  encephalomalacia from the prior large right MCA distribution infarct. Distribution not appreciably changed from 02/26/2020. Periventricular white matter and corona radiata hypodensities favor chronic ischemic microvascular white matter disease. Chronic punctate calcifications in the left frontal lobe parenchyma on images 10 through 12 of series 5, likely dystrophic and incidental. Otherwise, the brainstem, cerebellum, cerebral peduncles, thalamus, basal ganglia, basilar cisterns, and ventricular system appear within normal limits. No intracranial hemorrhage, mass lesion, or acute CVA. Vascular: Unremarkable Skull: Prior right temporal partial craniectomy and right frontotemporoparietal craniotomy. Sinuses/Orbits: Mild chronic ethmoid sinusitis. Other: No supplemental non-categorized findings. IMPRESSION: 1. No acute intracranial findings. 2. Chronic encephalomalacia related to the large right MCA distribution infarct, no change from 02/26/2020. 3. Chronic punctate calcifications in the left frontal lobe, likely dystrophic, unchanged. 4. Mild chronic ethmoid sinusitis. Electronically Signed   By: Van Clines M.D.   On: 04/04/2021 18:07   MR BRAIN WO CONTRAST  Result Date: 04/07/2021 CLINICAL DATA:  61 year old male with right upper extremity tingling and numbness. Large chronic right MCA infarct. EXAM: MRI HEAD WITHOUT CONTRAST TECHNIQUE: Multiplanar, multiecho pulse sequences of the brain and surrounding structures were obtained without intravenous contrast. COMPARISON:  Brain MRI 04/05/2021 and earlier head CTs. FINDINGS: Brain: Large area of chronic encephalomalacia in the right hemisphere, affecting the right MCA territory and adjacent watershed areas. Brainstem Wallerian degeneration. Ex vacuo ventricular enlargement. Previous right side craniotomy superimposed. No convincing restricted diffusion to suggest acute infarction. No midline shift, mass effect, evidence of mass lesion, or acute intracranial  hemorrhage. Cervicomedullary junction and pituitary are within normal limits. Patchy white matter T2 and FLAIR hyperintensity in the left hemisphere is stable. No new signal abnormality. Vascular: Major intracranial vascular flow voids are stable with absent flow signal in the right ICA siphon. Evidence of some reconstituted flow at the right ICA terminus. Skull and upper cervical spine: Previous right side craniotomy. Otherwise negative. Visualized bone marrow signal is within normal limits. Sinuses/Orbits: Stable, negative. Other: Visible internal auditory structures appear normal. Postoperative changes to the right scalp. IMPRESSION: Large chronic right hemisphere infarct with No acute intracranial abnormality. Electronically Signed   By: Genevie Ann M.D.   On: 04/07/2021 09:31   MR BRAIN WO CONTRAST  Result Date: 04/05/2021 CLINICAL DATA:  Acute neuro deficit.  Slurred speech EXAM: MRI HEAD WITHOUT CONTRAST TECHNIQUE: Multiplanar, multiecho pulse sequences of the brain and surrounding structures were obtained without intravenous contrast. COMPARISON:  CT head 11/21/2020 FINDINGS: Brain: Negative for acute infarct Large territory chronic infarct right MCA territory involving basal ganglia. Mild amount of chronic hemorrhage in this infarct. Chronic microhemorrhage in the left frontal lobe. Negative for mass lesion. Right lateral ventricle enlarged due to volume loss from right MCA infarct. Vascular: Normal arterial flow voids Skull and upper cervical spine: No focal skeletal lesion. Sinuses/Orbits: Mild mucosal edema paranasal sinuses. Negative orbit Other: None IMPRESSION: Negative for acute infarct Large territory chronic right MCA infarct. Electronically Signed   By: Franchot Gallo M.D.   On: 04/05/2021 14:29   US RENAL  Result Date: 04/07/2021 CLINICAL DATA:  Hematuria. EXAM: RENAL / URINARY TRACT ULTRASOUND COMPLETE COMPARISON:  None. FINDINGS: Right Kidney: Renal measurements: 10.8 x 5.3 x 5.2 cm =  volume: 155 mL. Echogenicity within normal limits. No mass or hydronephrosis visualized. Left Kidney: Renal measurements: 10.9 x 5.9 x 5.9 cm = volume: 200 mL. Echogenicity within normal limits. 10 mm nonobstructive calculus is noted in midpole. No mass or hydronephrosis visualized. Bladder: Left ureteral jet is noted. Right ureteral jet is not visualized. Multiple echogenic foci are noted in the pendant portion of urinary bladder, the largest measuring 1.2 cm. This is concerning for bladder calculi. Possible calculus seen in dilated distal right ureter. Other: None. IMPRESSION: Probable nonobstructive left renal calculus. No other renal abnormality is noted. Possible bladder calculi. Possible calculus seen in dilated distal right ureter. CT urogram is recommended for further evaluation. Electronically Signed   By: Marijo Conception M.D.   On: 04/07/2021 16:30   DG Chest Port 1 View  Result Date: 04/04/2021 CLINICAL DATA:  Altered level of consciousness, combative EXAM: PORTABLE CHEST 1 VIEW COMPARISON:  11/21/2020 FINDINGS: 2 frontal views of the chest are obtained. Cardiac silhouette is unremarkable given supine AP positioning. No airspace disease, effusion, or pneumothorax. No acute bony abnormalities. IMPRESSION: 1. No acute intrathoracic process. Electronically Signed   By: Randa Ngo M.D.   On: 04/04/2021 18:17   EEG adult  Result Date: 04/05/2021 Lora Havens, MD     04/05/2021 11:01 AM Patient Name: Barclay Lennox MRN: [id number] Epilepsy Attending: Lora Havens Referring Physician/Provider: Vernelle Emerald, MD Date: 04/05/2021 Duration: 22.36 mins Patient history:  61 y.o. 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  EEG to evaluate for seizure Level of alertness: Awake/lethargic AEDs  during EEG study: LEV Technical aspects: This EEG study was done with scalp electrodes positioned according to the 10-20 International system of electrode placement. Electrical activity was acquired at a sampling rate of $Remov'500Hz'nRfYjf$  and reviewed with a high frequency filter of $RemoveB'70Hz'ZulkcHTj$  and a low frequency filter of $RemoveB'1Hz'WIBzCtBh$ . EEG data were recorded continuously and digitally stored. Description: No clear posterior dominant rhythm was seen.  EEG showed continuous generalized 3 to 6 Hz theta-delta slowing admixed with intermittent  generalized 15 to 18 Hz beta activity. Hyperventilation and photic stimulation were not performed.   EKG artifact was seen throughout the study. ABNORMALITY - Continuous slow, generalized IMPRESSION: This study is suggestive of moderate diffuse encephalopathy, nonspecific etiology but could be related to sedation. No seizures or epileptiform discharges were seen throughout the recording. Lora Havens   ECHOCARDIOGRAM COMPLETE  Result Date: 04/06/2021    ECHOCARDIOGRAM REPORT   Patient Name:   ANTINO MAYABB Date of Exam: 04/06/2021 Medical Rec #:  [id number]      Height:       72.0 in Accession #:    [id number]     Weight:       208.9 lb Date of Birth:  09-14-1960      BSA:          2.170 m Patient Age:    27 years       BP:           131/91 mmHg Patient Gender: M              HR:  77 bpm. Exam Location:  Inpatient Procedure: 2D Echo, Cardiac Doppler and Color Doppler Indications:    I48.91 AFIB  History:        Patient has no prior history of Echocardiogram examinations.                 Stroke; Risk Factors:Diabetes and Dyslipidemia.  Sonographer:    Beryle Beams Referring Phys: [id number] [place]  1. Left ventricular ejection fraction, by estimation, is 60 to 65%. The left ventricle has normal function. The left ventricle has no regional wall motion abnormalities. There is mild concentric left ventricular hypertrophy. Left ventricular diastolic parameters are  indeterminate.  2. Right ventricular systolic function is normal. The right ventricular size is normal. Tricuspid regurgitation signal is inadequate for assessing PA pressure.  3. The mitral valve is normal in structure. No evidence of mitral valve regurgitation. No evidence of mitral stenosis.  4. The aortic valve is normal in structure. Aortic valve regurgitation is not visualized. No aortic stenosis is present.  5. The inferior vena cava is normal in size with greater than 50% respiratory variability, suggesting right atrial pressure of 3 mmHg. Comparison(s): No prior Echocardiogram. FINDINGS  Left Ventricle: Left ventricular ejection fraction, by estimation, is 60 to 65%. The left ventricle has normal function. The left ventricle has no regional wall motion abnormalities. The left ventricular internal cavity size was normal in size. There is  mild concentric left ventricular hypertrophy. Left ventricular diastolic parameters are indeterminate. Right Ventricle: The right ventricular size is normal. No increase in right ventricular wall thickness. Right ventricular systolic function is normal. Tricuspid regurgitation signal is inadequate for assessing PA pressure. Left Atrium: Left atrial size was normal in size. Right Atrium: Right atrial size was normal in size. Pericardium: There is no evidence of pericardial effusion. Mitral Valve: The mitral valve is normal in structure. No evidence of mitral valve regurgitation. No evidence of mitral valve stenosis. Tricuspid Valve: The tricuspid valve is normal in structure. Tricuspid valve regurgitation is not demonstrated. No evidence of tricuspid stenosis. Aortic Valve: The aortic valve is normal in structure. Aortic valve regurgitation is not visualized. No aortic stenosis is present. Aortic valve mean gradient measures 4.0 mmHg. Aortic valve peak gradient measures 8.2 mmHg. Aortic valve area, by VTI measures 3.38 cm. Pulmonic Valve: The pulmonic valve was normal in  structure. Pulmonic valve regurgitation is not visualized. No evidence of pulmonic stenosis. Aorta: The aortic root is normal in size and structure. Venous: The inferior vena cava is normal in size with greater than 50% respiratory variability, suggesting right atrial pressure of 3 mmHg. IAS/Shunts: No atrial level shunt detected by color flow Doppler.  LEFT VENTRICLE PLAX 2D LVIDd:         4.60 cm     Diastology LVIDs:         2.50 cm     LV e' medial:    5.78 cm/s LV PW:         1.30 cm     LV E/e' medial:  7.0 LV IVS:        1.20 cm     LV e' lateral:   8.77 cm/s LVOT diam:     2.20 cm     LV E/e' lateral: 4.6 LV SV:         77 LV SV Index:   35 LVOT Area:     3.80 cm  LV Volumes (MOD) LV vol d, MOD A4C: 53.8 ml LV vol s,  MOD A4C: 12.1 ml LV SV MOD A4C:     53.8 ml RIGHT VENTRICLE RV S prime:     22.80 cm/s TAPSE (M-mode): 2.7 cm LEFT ATRIUM             Index        RIGHT ATRIUM           Index LA diam:        3.40 cm 1.57 cm/m   RA Area:     11.60 cm LA Vol (A2C):   57.0 ml 26.26 ml/m  RA Volume:   21.20 ml  9.77 ml/m LA Vol (A4C):   61.5 ml 28.34 ml/m LA Biplane Vol: 60.6 ml 27.92 ml/m  AORTIC VALVE                    PULMONIC VALVE AV Area (Vmax):    3.32 cm     PV Vmax:       0.76 m/s AV Area (Vmean):   3.32 cm     PV Vmean:      44.300 cm/s AV Area (VTI):     3.38 cm     PV VTI:        0.105 m AV Vmax:           143.00 cm/s  PV Peak grad:  2.3 mmHg AV Vmean:          90.900 cm/s  PV Mean grad:  1.0 mmHg AV VTI:            0.227 m AV Peak Grad:      8.2 mmHg AV Mean Grad:      4.0 mmHg LVOT Vmax:         125.00 cm/s LVOT Vmean:        79.300 cm/s LVOT VTI:          0.202 m LVOT/AV VTI ratio: 0.89  AORTA Ao Asc diam: 3.50 cm MITRAL VALVE MV Area (PHT): 2.50 cm    SHUNTS MV Decel Time: 304 msec    Systemic VTI:  0.20 m MV E velocity: 40.70 cm/s  Systemic Diam: 2.20 cm MV A velocity: 60.90 cm/s MV E/A ratio:  0.67 Kardie Tobb DO Electronically signed by Berniece Salines DO Signature Date/Time:  04/06/2021/1:05:18 PM    Final    CT RENAL STONE STUDY  Result Date: 04/08/2021 CLINICAL DATA:  Nephrolithiasis EXAM: CT ABDOMEN AND PELVIS WITHOUT CONTRAST TECHNIQUE: Multidetector CT imaging of the abdomen and pelvis was performed following the standard protocol without IV contrast. RADIATION DOSE REDUCTION: This exam was performed according to the departmental dose-optimization program which includes automated exposure control, adjustment of the mA and/or kV according to patient size and/or use of iterative reconstruction technique. COMPARISON:  None. FINDINGS: Lower chest: Small linear densities in the lower lung fields may suggest scarring or subsegmental atelectasis. There is elevation of left hemidiaphragm. Scattered coronary artery calcifications are seen. Hepatobiliary: No focal abnormality is seen in the liver. Surgical clips are seen in gallbladder fossa. Pancreas: No focal abnormality is seen. Spleen: Unremarkable. Adrenals/Urinary Tract: There is mild hyperplasia of left adrenal. There is no hydronephrosis. There is 10 mm calculus in the mid to lower portion of left kidney. There are 2 tiny calculi in the lower pole of left kidney each measuring less than 3 mm. There are 2 small calcifications in the lower pole of right kidney larger 1 measuring 5.5 mm. There are no calcific densities in the courses of the ureters.  There is a 5 mm calculus in the dependent portion of urinary bladder. There is a bladder diverticulum in the right posterolateral margin of the urinary bladder measuring approximally 2.6 cm. There is 11 mm calculus in the bladder diverticulum on the right side. In image 78 of series 3, there is 14 mm smooth marginated fluid density lesion in the left side of pelvis with no demonstrable communication to the bladder. This could possibly suggest a necrotic lymph node. Stomach/Bowel: Small hiatal hernia is seen. Small bowel loops are not dilated. Appendix is not dilated. There is no significant  focal wall thickening in the colon. There is gaseous distention of sigmoid colon. Vascular/Lymphatic: There are scattered arterial calcifications. Inferior vena caval filter is seen. Reproductive: Coarse calcifications are seen in the prostate. Prostate is smaller than usual in size. Other: There is no ascites or pneumoperitoneum. Small umbilical hernia containing fat is seen. Small bilateral inguinal hernias containing fat are noted, larger on the right side. Musculoskeletal: Degenerative changes are noted in the lumbar spine IMPRESSION: There is no hydronephrosis. Bilateral renal stones largest measuring 10 mm in the left kidney. There is 5 mm calculus in the dependent portion of urinary bladder suggesting bladder calculus. There is 10 mm calculus within a diverticulum in the right posterolateral margin of urinary bladder. There is no evidence of intestinal obstruction or pneumoperitoneum. Appendix is not dilated. Small hiatal hernia. Other findings as described in the body of the report. Electronically Signed   By: Elmer Picker M.D.   On: 04/08/2021 15:22    Microbiology: Results for orders placed or performed during the hospital encounter of 04/04/21  Resp Panel by RT-PCR (Flu A&B, Covid) Nasopharyngeal Swab     Status: None   Collection Time: 04/04/21  4:37 PM   Specimen: Nasopharyngeal Swab; Nasopharyngeal(NP) swabs in vial transport medium  Result Value Ref Range Status   SARS Coronavirus 2 by RT PCR NEGATIVE NEGATIVE Final    Comment: (NOTE) SARS-CoV-2 target nucleic acids are NOT DETECTED.  The SARS-CoV-2 RNA is generally detectable in upper respiratory specimens during the acute phase of infection. The lowest concentration of SARS-CoV-2 viral copies this assay can detect is 138 copies/mL. A negative result does not preclude SARS-Cov-2 infection and should not be used as the sole basis for treatment or other patient management decisions. A negative result may occur with  improper  specimen collection/handling, submission of specimen other than nasopharyngeal swab, presence of viral mutation(s) within the areas targeted by this assay, and inadequate number of viral copies(<138 copies/mL). A negative result must be combined with clinical observations, patient history, and epidemiological information. The expected result is Negative.  Fact Sheet for Patients:  EntrepreneurPulse.com.au  Fact Sheet for Healthcare Providers:  IncredibleEmployment.be  This test is no t yet approved or cleared by the [place] FDA and  has been authorized for detection and/or diagnosis of SARS-CoV-2 by FDA under an Emergency Use Authorization (EUA). This EUA will remain  in effect (meaning this test can be used) for the duration of the COVID-19 declaration under Section 564(b)(1) of the Act, 21 U.S.C.section 360bbb-3(b)(1), unless the authorization is terminated  or revoked sooner.       Influenza A by PCR NEGATIVE NEGATIVE Final   Influenza B by PCR NEGATIVE NEGATIVE Final    Comment: (NOTE) The Xpert Xpress SARS-CoV-2/FLU/RSV plus assay is intended as an aid in the diagnosis of influenza from Nasopharyngeal swab specimens and should not be used as a sole basis for treatment. Nasal  washings and aspirates are unacceptable for Xpert Xpress SARS-CoV-2/FLU/RSV testing.  Fact Sheet for Patients: EntrepreneurPulse.com.au  Fact Sheet for Healthcare Providers: IncredibleEmployment.be  This test is not yet approved or cleared by the [place] FDA and has been authorized for detection and/or diagnosis of SARS-CoV-2 by FDA under an Emergency Use Authorization (EUA). This EUA will remain in effect (meaning this test can be used) for the duration of the COVID-19 declaration under Section 564(b)(1) of the Act, 21 U.S.C. section 360bbb-3(b)(1), unless the authorization is terminated or revoked.  Performed at  [facility], [place] [address], [place], [place] [address]     Labs: CBC: Recent Labs  Lab 04/04/21 1716 04/05/21 7793 04/06/21 0352 04/07/21 0206 04/07/21 0603 04/08/21 0159 04/09/21 0226  WBC 10.5 9.7 7.0 7.6 6.4 9.0 6.6  NEUTROABS 8.7* 6.1  --   --   --  3.8 3.1  HGB 15.3 14.8 15.2 14.4 14.0 14.3 14.3  HCT 45.5 44.8 44.4 43.5 41.9 44.4 42.6  MCV 87.5 87.8 86.2 86.8 86.6 87.6 88.0  PLT 203 198 180 177 162 203 903   Basic Metabolic Panel: Recent Labs  Lab 04/05/21 0312 04/06/21 0352 04/07/21 0206 04/08/21 0159 04/09/21 0226  NA 137 136 136 138 137  K 3.4* 3.2* 3.0* 3.3* 3.9  CL 103 104 106 108 107  CO2 22 20* 20* 18* 19*  GLUCOSE 285* 182* 120* 76 127*  BUN $Re'17 12 12 11 13  'sIu$ CREATININE 1.54* 1.29* 1.32* 1.46* 1.47*  CALCIUM 9.1 8.3* 8.4* 8.8* 9.0  MG 1.9 1.5* 1.9  --  1.6*   Liver Function Tests: Recent Labs  Lab 04/04/21 1716 04/05/21 0312  AST 27 21  ALT 20 17  ALKPHOS 115 96  BILITOT 0.8 0.7  PROT 9.1* 8.3*  ALBUMIN 3.9 3.6   CBG: Recent Labs  Lab 04/08/21 1105 04/08/21 1651 04/08/21 2050 04/09/21 0551 04/09/21 1109  GLUCAP 279* 227* 211* 172* 248*    Discharge time spent: greater than 30 minutes.  Signed: Alma Friendly, MD [facility] 04/09/2021

## 2021-04-09 NOTE — Plan of Care (Signed)
Problem: Safety: Goal: Non-violent Restraint(s) Outcome: Completed/Met   Problem: Education: Goal: Knowledge of disease or condition will improve Outcome: Completed/Met   Problem: Coping: Goal: Will verbalize positive feelings about self Outcome: Completed/Met   Problem: Ischemic Stroke/TIA Tissue Perfusion: Goal: Complications of ischemic stroke/TIA will be minimized Outcome: Completed/Met   Problem: Spontaneous Subarachnoid Hemorrhage Tissue Perfusion: Goal: Complications of Spontaneous Subarachnoid Hemorrhage will be minimized Outcome: Completed/Met   Problem: Education: Goal: Expressions of having a comfortable level of knowledge regarding the disease process will increase Outcome: Completed/Met   Problem: Coping: Goal: Ability to adjust to condition or change in health will improve Outcome: Completed/Met Goal: Ability to identify appropriate support needs will improve Outcome: Completed/Met   Problem: Health Behavior/Discharge Planning: Goal: Compliance with prescribed medication regimen will improve Outcome: Completed/Met   Problem: Medication: Goal: Risk for medication side effects will decrease Outcome: Completed/Met   Problem: Clinical Measurements: Goal: Complications related to the disease process, condition or treatment will be avoided or minimized Outcome: Completed/Met Goal: Diagnostic test results will improve Outcome: Completed/Met   Problem: Education: Goal: Knowledge of General Education information will improve Description: Including pain rating scale, medication(s)/side effects and non-pharmacologic comfort measures Outcome: Completed/Met   Problem: Health Behavior/Discharge Planning: Goal: Ability to manage health-related needs will improve Outcome: Completed/Met   Problem: Clinical Measurements: Goal: Ability to maintain clinical measurements within normal limits will improve Outcome: Completed/Met Goal: Will remain free from  infection Outcome: Completed/Met Goal: Diagnostic test results will improve Outcome: Completed/Met   Problem: Activity: Goal: Risk for activity intolerance will decrease Outcome: Completed/Met   Problem: Elimination: Goal: Will not experience complications related to bowel motility Outcome: Completed/Met Goal: Will not experience complications related to urinary retention Outcome: Completed/Met   Problem: Pain Managment: Goal: General experience of comfort will improve Outcome: Completed/Met   Problem: Safety: Goal: Ability to remain free from injury will improve Outcome: Completed/Met   Problem: Skin Integrity: Goal: Risk for impaired skin integrity will decrease Outcome: Completed/Met   Discharge instructions reviewed with patient and son.  These included, but were not limited to, the following:  Discharge medications, insulin and blood sugar testing recommendations, when to call the MD, follow-up appointments, stroke warning signs, hypo and hyper-glycemia, etc.  Pt discharged to private residence accompanied by son.  Escorted to exit via wheelchair by nurse tech. With belongings and discharge paperwork.

## 2021-04-09 NOTE — Progress Notes (Addendum)
Inpatient Diabetes Program Recommendations  AACE/ADA: New Consensus Statement on Inpatient Glycemic Control (2015)  Target Ranges:  Prepandial:   less than 140 mg/dL      Peak postprandial:   less than 180 mg/dL (1-2 hours)      Critically ill patients:  140 - 180 mg/dL   Lab Results  Component Value Date   GLUCAP 248 (H) 04/09/2021   HGBA1C 11.9 (H) 04/05/2021    Review of Glycemic Control  Latest Reference Range & Units 04/09/21 05:51 04/09/21 11:09  Glucose-Capillary 70 - 99 mg/dL 160 (H) 109 (H)  (H): Data is abnormally high  Diabetes history: DM2 Outpatient Diabetes medications: Metformin 500 mg BID Current orders for Inpatient glycemic control: Semglee 20 units QD, Novolog 0-15 units TID and HS, Novolog 6 units TID with meals  Inpatient Diabetes Program Recommendations:    Spoke with Dr. Sharolyn Douglas.  She would like to discharge patient today.  He has been on insulin in the past and We have reviewed insulin administration, hypoglycemia, signs and symptoms.    For discharge, please consider-  Lantus 20 units QHS Novolog 6 units TID with meals  Spoke with him again today over the phone and reviewed insulin administration.  Follow up with the [facility].  Should be able to get prescriptions at local pharmacy as the [place] is closed today.  He states he has [facility] Medicare supplement with medication coverage.  Asked hm to check his blood sugar fasting and mid afternoon.  He verbalizes how to treat hypoglycemia.    Lantus Solostar insulin pen order [id number] Novolog Flex pen order [id number] Insulin pen needles order [phone] Glucometer [id number]  Will continue to follow while inpatient.  Thank you, Dulce Sellar, MSN, RN Diabetes Coordinator Inpatient Diabetes Program [phone] (team pager from 8a-5p)

## 2021-04-11 LAB — TYPE AND SCREEN
ABO/RH(D): O POS
Antibody Screen: POSITIVE
DAT, IgG: NEGATIVE
Donor AG Type: NEGATIVE
Donor AG Type: NEGATIVE
PT AG Type: NEGATIVE
Unit division: 0
Unit division: 0

## 2021-04-11 LAB — BPAM RBC
Blood Product Expiration Date: 202302222359
Blood Product Expiration Date: 202302222359
Unit Type and Rh: 5100
Unit Type and Rh: 5100

## 2021-04-19 NOTE — Progress Notes (Unsigned)
Cardiology Office Note    Date:  04/19/2021   ID:  Eddie Conway, DOB 1960/10/23, MRN [id number]   PCP:  Clinic, [facility]  Cardiologist:  Freada Bergeron, MD *** Advanced Practice Provider:  No care team member to display Electrophysiologist:  None   [id number]   No chief complaint on file.   History of Present Illness:  Eddie Conway is a 61 y.o. 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 TIA 24 hrs nonsensical speech, agitation, odd behavior. Had new Afib with RVR converted NSR on Eliquis and diltiazem. Echo LVEF 60-65%.    Past Medical History:  Diagnosis Date   Diabetes mellitus without complication ([place])    Hyperlipemia    Stroke [facility])     Past Surgical History:  Procedure Laterality Date   BRAIN SURGERY      Current Medications: No outpatient medications have been marked as taking for the 04/27/21 encounter (Appointment) with Imogene Burn, PA-C.     Allergies:   Patient has no known allergies.   Social History   Socioeconomic History   Marital status: Single    Spouse name: Not on file   Number of children: Not on file   Years of education: Not on file   Highest education level: Not on file  Occupational History   Not on file  Tobacco Use   Smoking status: Never   Smokeless tobacco: Never  Vaping Use   Vaping Use: Never used  Substance and Sexual Activity   Alcohol use: Not Currently   Drug use: Not Currently   Sexual activity: Not on file  Other Topics Concern   Not on file  Social History Narrative   Not on file   Social Determinants of Health   Financial Resource Strain: Not on file  Food Insecurity: Not on file   Transportation Needs: Not on file  Physical Activity: Not on file  Stress: Not on file  Social Connections: Not on file     Family History:  The patient's ***family history includes Diabetes in his father and mother; Heart failure in his father; Hypertension in his father.   ROS:   Please see the history of present illness.    ROS All other systems reviewed and are negative.   PHYSICAL EXAM:   VS:  There were no vitals taken for this visit.  Physical Exam  GEN: Well nourished, well developed, in no acute distress  HEENT: normal  Neck: no JVD, carotid bruits, or masses Cardiac:RRR; no murmurs, rubs, or gallops  Respiratory:  clear to auscultation bilaterally, normal work of breathing GI: soft, nontender, nondistended, + BS Ext: without cyanosis, clubbing, or edema, Good distal pulses bilaterally MS: no deformity or atrophy  Skin: warm and dry, no rash Neuro:  Alert and Oriented x 3, Strength and sensation are intact Psych: euthymic mood, full affect  Wt Readings from Last 3 Encounters:  04/05/21 208 lb 14.4 oz (94.8 kg)  11/21/20 238 lb (108 kg)  05/07/20 215 lb (97.5 kg)      Studies/Labs Reviewed:   EKG:  EKG is*** ordered today.  The ekg ordered today demonstrates ***  Recent Labs: 11/21/2020: B Natriuretic Peptide 225.9 04/04/2021: TSH 2.703 04/05/2021: ALT 17 04/09/2021:  BUN 13; Creatinine, Ser 1.47; Hemoglobin 14.3; Magnesium 1.6; Platelets 185; Potassium 3.9; Sodium 137   Lipid Panel No results found for: CHOL, TRIG, HDL, CHOLHDL, VLDL, LDLCALC, LDLDIRECT  Additional studies/ records that were reviewed today include:  TTE 04/06/21: IMPRESSIONS   1. Left ventricular ejection fraction, by estimation, is 60 to 65%. The  left ventricle has normal function. The left ventricle has no regional  wall motion abnormalities. There is mild concentric left ventricular  hypertrophy. Left ventricular diastolic  parameters are indeterminate.   2. Right ventricular systolic  function is normal. The right ventricular  size is normal. Tricuspid regurgitation signal is inadequate for assessing  PA pressure.   3. The mitral valve is normal in structure. No evidence of mitral valve  regurgitation. No evidence of mitral stenosis.   4. The aortic valve is normal in structure. Aortic valve regurgitation is  not visualized. No aortic stenosis is present.   5. The inferior vena cava is normal in size with greater than 50%  respiratory variability, suggesting right atrial pressure of 3 mmHg.   Comparison(s): No prior Echocardiogram   Risk Assessment/Calculations:   {Does this patient have ATRIAL FIBRILLATION?:[id number]}     ASSESSMENT:    1. Paroxysmal atrial fibrillation (HCC)   2. Acute metabolic encephalopathy   3. Essential hypertension   4. Uncontrolled type 2 diabetes mellitus with hyperglycemia, without long-term current use of insulin ([place])   5. History of right MCA stroke      PLAN:  In order of problems listed above:  PAF with RVR in the setting of acute metabolic encephalopathy with hyperglycemia over 500 converted to normal sinus rhythm.  Patient was discharged on diltiazem and Eliquis.  Did have some hematuria in the hospital but Eliquis resumed after that resolved  Acute metabolic encephalopathy likely due to hyperglycemia  Hypertension  Uncontrolled diabetes mellitus with hyperglycemia hemoglobin A1c 11  Prior MCA stroke with residual left-sided weakness  Shared Decision Making/Informed Consent   {Are you ordering a CV Procedure (e.g. stress test, cath, DCCV, TEE, etc)?   Press F2        :[id number]    Medication Adjustments/Labs and Tests Ordered: Current medicines are reviewed at length with the patient today.  Concerns regarding medicines are outlined above.  Medication changes, Labs and Tests ordered today are listed in the Patient Instructions below. There are no Patient Instructions on file for this visit.   Signed, Ermalinda Barrios, PA-C  04/19/2021 1:59 PM    [facility] [place], [place], [place]  [address] Phone: [phone]; Fax: [phone]

## 2021-04-27 ENCOUNTER — Ambulatory Visit: Payer: Medicare (Managed Care) | Admitting: Physician Assistant

## 2021-04-27 DIAGNOSIS — I480 Paroxysmal atrial fibrillation: Secondary | ICD-10-CM

## 2021-04-27 DIAGNOSIS — I10 Essential (primary) hypertension: Secondary | ICD-10-CM

## 2021-04-27 DIAGNOSIS — E1165 Type 2 diabetes mellitus with hyperglycemia: Secondary | ICD-10-CM

## 2021-04-27 DIAGNOSIS — G9341 Metabolic encephalopathy: Secondary | ICD-10-CM

## 2021-04-27 DIAGNOSIS — Z8673 Personal history of transient ischemic attack (TIA), and cerebral infarction without residual deficits: Secondary | ICD-10-CM

## 2022-02-27 IMAGING — CR DG CHEST 2V
2 series · 2 of 2 positions shown · non-contrast
Comparison: Chest CT from 02/06/2020

CLINICAL DATA: Chest pain and shortness of breath.  Dry cough.

EXAM:
CHEST - 2 VIEW

[chest pa]
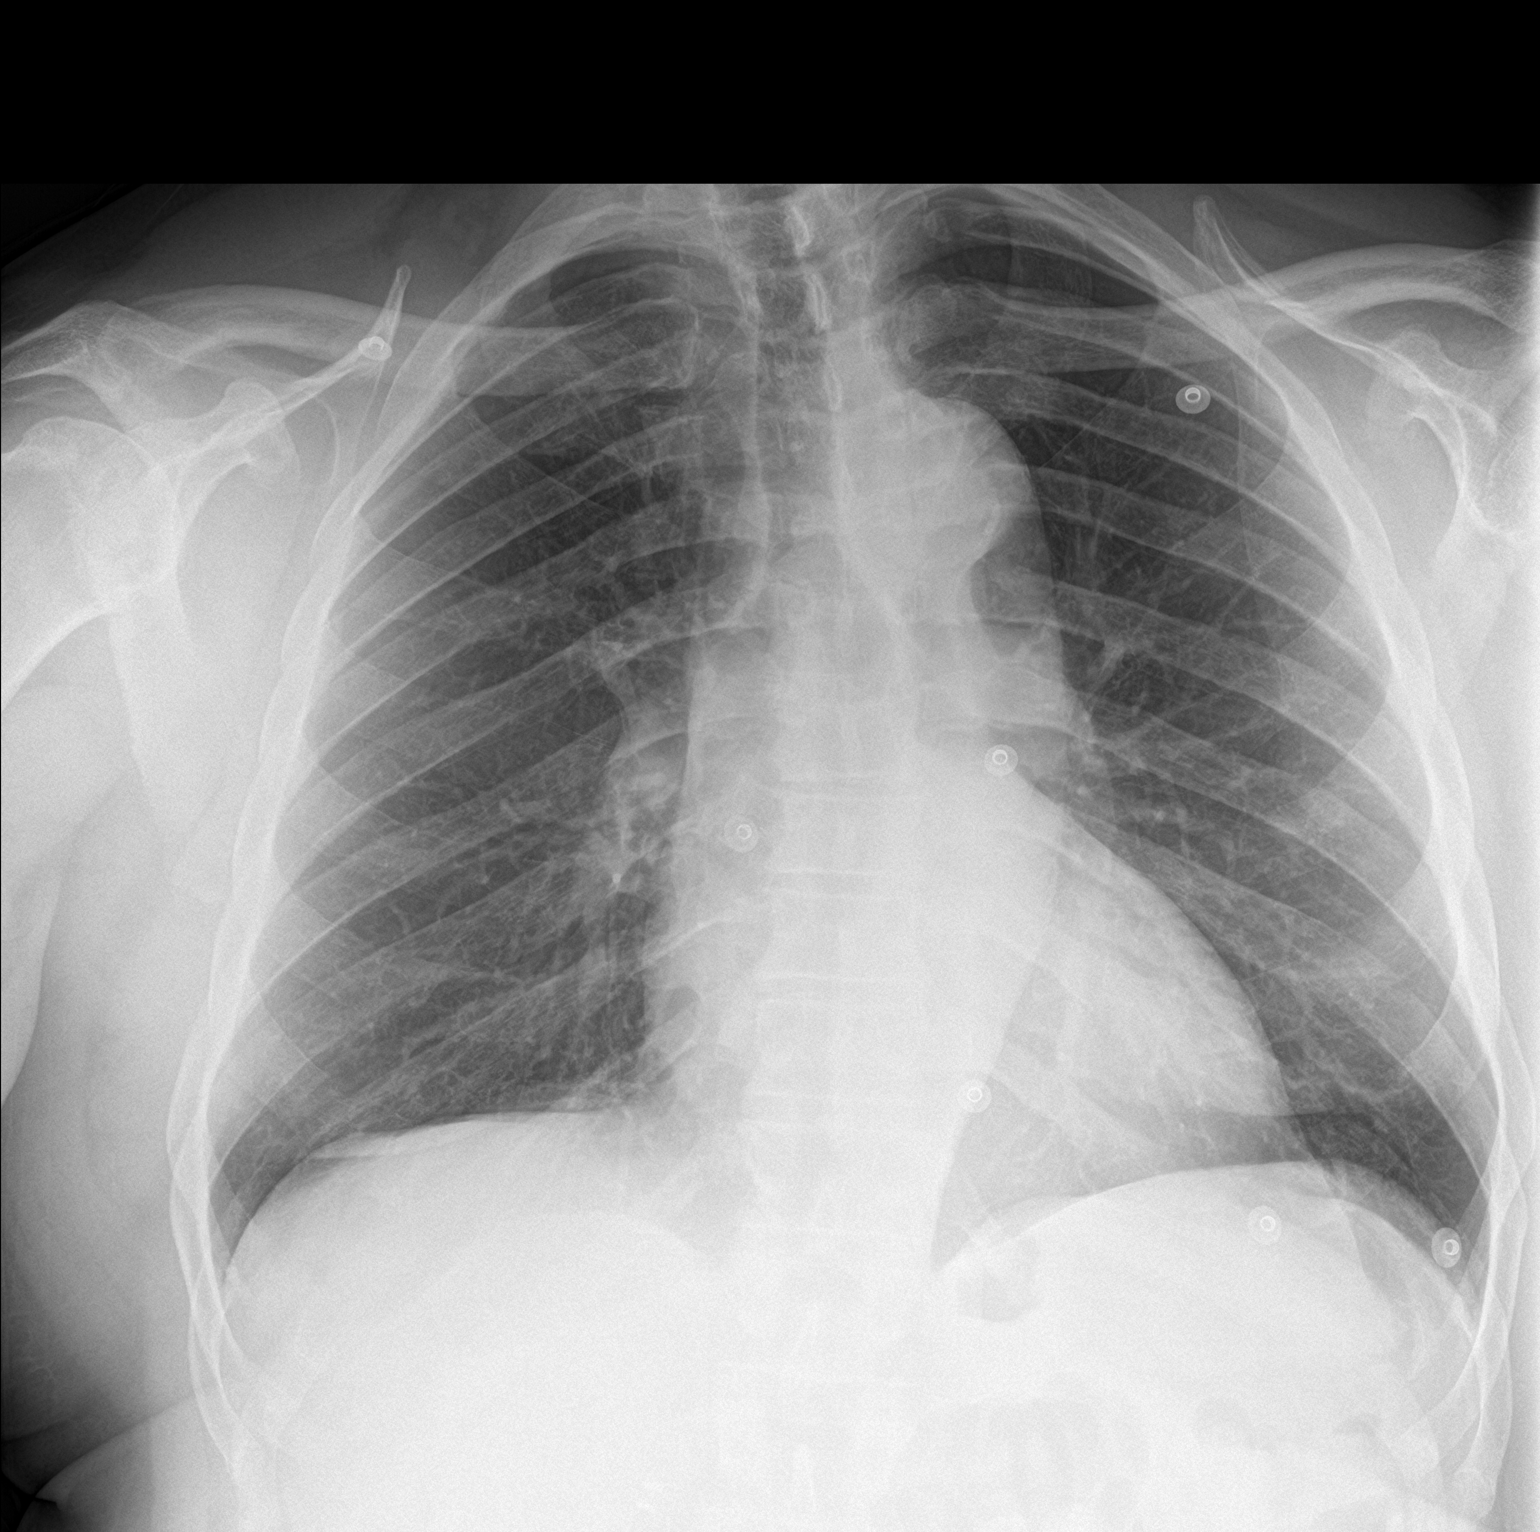

[chest lat]
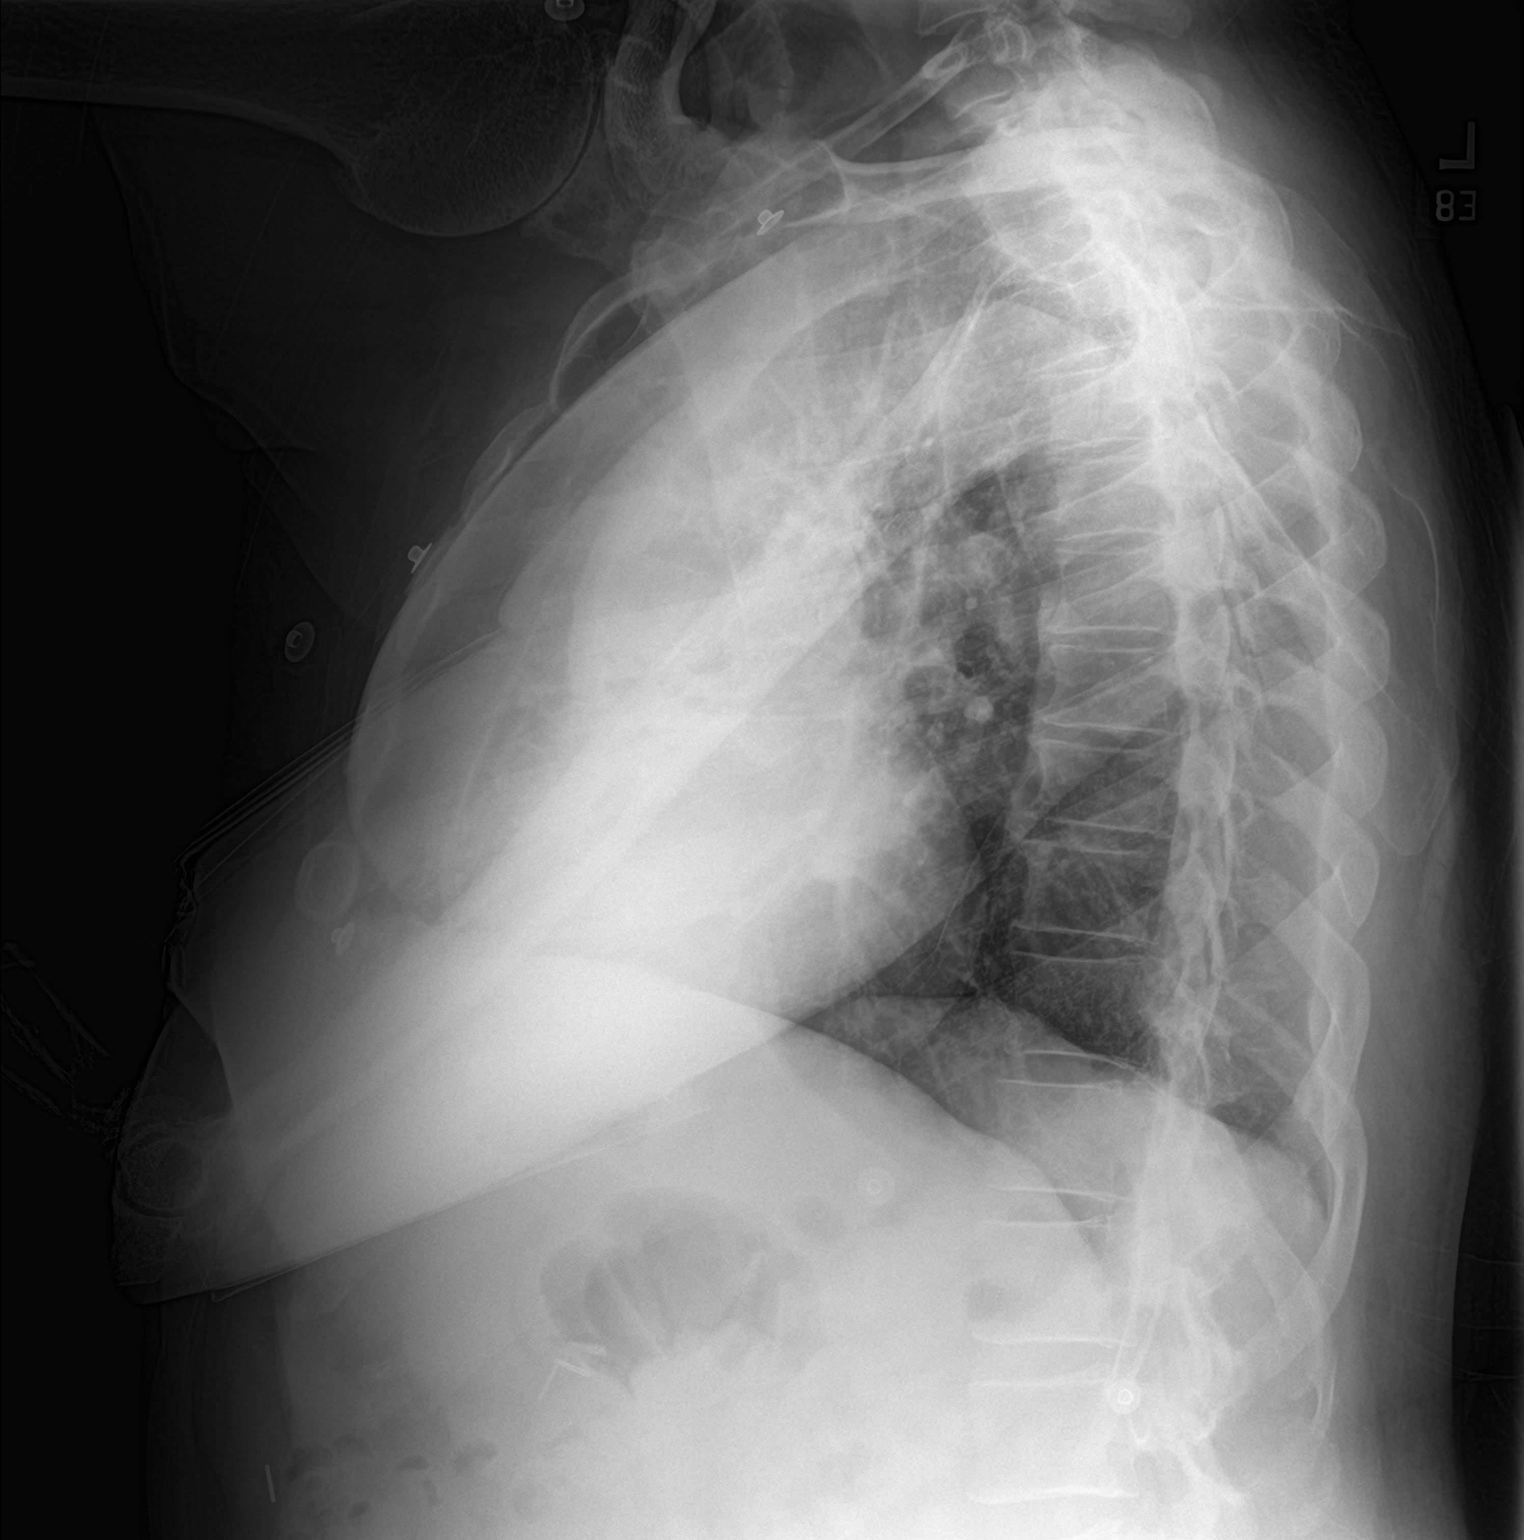

[2 of 2 positions shown; findings below may reference images not displayed]

FINDINGS: Atherosclerotic calcification of the aortic arch. Heart size within
normal limits. Mild levoconvex thoracic scoliosis.

The lungs appear clear. No blunting of the costophrenic angles. No
discrete bony abnormality is identified.
IMPRESSION: 1.  No active cardiopulmonary disease is radiographically apparent.
2.  Aortic Atherosclerosis ([id number].H).
3. Mild levoconvex thoracic scoliosis.

## 2022-05-23 ENCOUNTER — Emergency Department (HOSPITAL_COMMUNITY): Payer: Medicare PPO

## 2022-05-23 ENCOUNTER — Observation Stay (HOSPITAL_COMMUNITY)
Admission: EM | Admit: 2022-05-23 | Discharge: 2022-05-25 | Disposition: A | Discharge status: Home / Self Care | Payer: Medicare PPO | Attending: Internal Medicine | Admitting: Internal Medicine

## 2022-05-23 ENCOUNTER — Other Ambulatory Visit: Payer: Self-pay

## 2022-05-23 DIAGNOSIS — E1110 Type 2 diabetes mellitus with ketoacidosis without coma: Secondary | ICD-10-CM | POA: Diagnosis present

## 2022-05-23 DIAGNOSIS — I480 Paroxysmal atrial fibrillation: Secondary | ICD-10-CM | POA: Insufficient documentation

## 2022-05-23 DIAGNOSIS — Z79899 Other long term (current) drug therapy: Secondary | ICD-10-CM | POA: Insufficient documentation

## 2022-05-23 DIAGNOSIS — E876 Hypokalemia: Secondary | ICD-10-CM | POA: Diagnosis not present

## 2022-05-23 DIAGNOSIS — Z23 Encounter for immunization: Secondary | ICD-10-CM | POA: Insufficient documentation

## 2022-05-23 DIAGNOSIS — E1165 Type 2 diabetes mellitus with hyperglycemia: Secondary | ICD-10-CM | POA: Diagnosis not present

## 2022-05-23 DIAGNOSIS — E871 Hypo-osmolality and hyponatremia: Secondary | ICD-10-CM | POA: Diagnosis not present

## 2022-05-23 DIAGNOSIS — R519 Headache, unspecified: Secondary | ICD-10-CM | POA: Insufficient documentation

## 2022-05-23 DIAGNOSIS — E782 Mixed hyperlipidemia: Secondary | ICD-10-CM | POA: Diagnosis present

## 2022-05-23 DIAGNOSIS — Z8673 Personal history of transient ischemic attack (TIA), and cerebral infarction without residual deficits: Secondary | ICD-10-CM

## 2022-05-23 DIAGNOSIS — I10 Essential (primary) hypertension: Secondary | ICD-10-CM | POA: Diagnosis present

## 2022-05-23 DIAGNOSIS — Z7901 Long term (current) use of anticoagulants: Secondary | ICD-10-CM | POA: Diagnosis not present

## 2022-05-23 DIAGNOSIS — B3742 Candidal balanitis: Secondary | ICD-10-CM | POA: Diagnosis present

## 2022-05-23 DIAGNOSIS — Z794 Long term (current) use of insulin: Secondary | ICD-10-CM | POA: Diagnosis not present

## 2022-05-23 DIAGNOSIS — R2689 Other abnormalities of gait and mobility: Secondary | ICD-10-CM | POA: Diagnosis not present

## 2022-05-23 DIAGNOSIS — R8290 Unspecified abnormal findings in urine: Secondary | ICD-10-CM | POA: Insufficient documentation

## 2022-05-23 DIAGNOSIS — E1010 Type 1 diabetes mellitus with ketoacidosis without coma: Secondary | ICD-10-CM

## 2022-05-23 LAB — TROPONIN I (HIGH SENSITIVITY): Troponin I (High Sensitivity): 15 ng/L (ref <18)

## 2022-05-23 LAB — URINALYSIS, ROUTINE W REFLEX MICROSCOPIC
Bilirubin Urine: NEGATIVE
Glucose, UA: >=500 mg/dL — AB
Ketones, ur: 20 mg/dL — AB
Nitrite: NEGATIVE
Protein, ur: NEGATIVE mg/dL
Specific Gravity, Urine: 1.021 (ref 1.005–1.030)
pH: 6 (ref 5.0–8.0)

## 2022-05-23 LAB — CBC WITH DIFFERENTIAL/PLATELET
Abs Immature Granulocytes: 0.02 10*3/uL (ref 0.00–0.07)
Basophils Absolute: 0 10*3/uL (ref 0.0–0.1)
Basophils Relative: 0 %
Eosinophils Absolute: 0.1 10*3/uL (ref 0.0–0.5)
Eosinophils Relative: 1 %
HCT: 44.6 % (ref 39.0–52.0)
Hemoglobin: 14.6 g/dL (ref 13.0–17.0)
Immature Granulocytes: 0 %
Lymphocytes Relative: 20 %
Lymphs Abs: 1.3 10*3/uL (ref 0.7–4.0)
MCH: 28.8 pg (ref 26.0–34.0)
MCHC: 32.7 g/dL (ref 30.0–36.0)
MCV: 88 fL (ref 80.0–100.0)
Monocytes Absolute: 0.5 10*3/uL (ref 0.1–1.0)
Monocytes Relative: 7 %
Neutro Abs: 4.7 10*3/uL (ref 1.7–7.7)
Neutrophils Relative %: 72 %
Platelets: 202 10*3/uL (ref 150–400)
RBC: 5.07 MIL/uL (ref 4.22–5.81)
RDW: 13.7 % (ref 11.5–15.5)
WBC: 6.7 10*3/uL (ref 4.0–10.5)
nRBC: 0 % (ref 0.0–0.2)

## 2022-05-23 LAB — BASIC METABOLIC PANEL
Anion gap: 18 — ABNORMAL HIGH (ref 5–15)
BUN: 24 mg/dL — ABNORMAL HIGH (ref 8–23)
CO2: 17 mmol/L — ABNORMAL LOW (ref 22–32)
Calcium: 9 mg/dL (ref 8.9–10.3)
Chloride: 90 mmol/L — ABNORMAL LOW (ref 98–111)
Creatinine, Ser: 1.59 mg/dL — ABNORMAL HIGH (ref 0.61–1.24)
GFR, Estimated: 49 mL/min — ABNORMAL LOW (ref >60)
Glucose, Bld: 799 mg/dL (ref 70–99)
Potassium: 3.6 mmol/L (ref 3.5–5.1)
Sodium: 125 mmol/L — ABNORMAL LOW (ref 135–145)

## 2022-05-23 LAB — I-STAT CHEM 8, ED
BUN: 26 mg/dL — ABNORMAL HIGH (ref 8–23)
Calcium, Ion: 1.05 mmol/L — ABNORMAL LOW (ref 1.15–1.40)
Chloride: 92 mmol/L — ABNORMAL LOW (ref 98–111)
Creatinine, Ser: 1.4 mg/dL — ABNORMAL HIGH (ref 0.61–1.24)
Glucose, Bld: >700 mg/dL (ref 70–99)
HCT: 49 % (ref 39.0–52.0)
Hemoglobin: 16.7 g/dL (ref 13.0–17.0)
Potassium: 3.6 mmol/L (ref 3.5–5.1)
Sodium: 129 mmol/L — ABNORMAL LOW (ref 135–145)
TCO2: 21 mmol/L — ABNORMAL LOW (ref 22–32)

## 2022-05-23 LAB — I-STAT VENOUS BLOOD GAS, ED
Acid-base deficit: 2 mmol/L (ref 0.0–2.0)
Bicarbonate: 22.2 mmol/L (ref 20.0–28.0)
Calcium, Ion: 1.13 mmol/L — ABNORMAL LOW (ref 1.15–1.40)
HCT: 49 % (ref 39.0–52.0)
Hemoglobin: 16.7 g/dL (ref 13.0–17.0)
O2 Saturation: 76 %
Potassium: 3.6 mmol/L (ref 3.5–5.1)
Sodium: 128 mmol/L — ABNORMAL LOW (ref 135–145)
TCO2: 23 mmol/L (ref 22–32)
pCO2, Ven: 37.1 mmHg — ABNORMAL LOW (ref 44–60)
pH, Ven: 7.385 (ref 7.25–7.43)
pO2, Ven: 41 mmHg (ref 32–45)

## 2022-05-23 LAB — CBG MONITORING, ED
Glucose-Capillary: 317 mg/dL — ABNORMAL HIGH (ref 70–99)
Glucose-Capillary: 440 mg/dL — ABNORMAL HIGH (ref 70–99)
Glucose-Capillary: >600 mg/dL (ref 70–99)
Glucose-Capillary: >600 mg/dL (ref 70–99)
Glucose-Capillary: >600 mg/dL (ref 70–99)
Glucose-Capillary: >600 mg/dL (ref 70–99)

## 2022-05-23 MED ORDER — INSULIN REGULAR(HUMAN) IN NACL 100-0.9 UT/100ML-% IV SOLN
INTRAVENOUS | Status: DC
  Administered 2022-05-23: 15 [IU]/h via INTRAVENOUS
  Filled 2022-05-23: qty 100

## 2022-05-23 MED ORDER — POTASSIUM CHLORIDE 10 MEQ/100ML IV SOLN
10.0000 meq | Freq: Once | INTRAVENOUS | Status: AC
  Administered 2022-05-23: 10 meq via INTRAVENOUS
  Filled 2022-05-23: qty 100

## 2022-05-23 MED ORDER — LACTATED RINGERS IV SOLN: INTRAVENOUS | Status: DC

## 2022-05-23 MED ORDER — DEXTROSE 50 % IV SOLN: 0.0000 mL | INTRAVENOUS | Status: DC | PRN

## 2022-05-23 MED ORDER — DEXTROSE IN LACTATED RINGERS 5 % IV SOLN
INTRAVENOUS | Status: DC
  Filled 2022-05-23: qty 1000

## 2022-05-23 MED ORDER — LACTATED RINGERS IV BOLUS
20.0000 mL/kg | Freq: Once | INTRAVENOUS | Status: AC
  Administered 2022-05-23: 1868 mL via INTRAVENOUS

## 2022-05-23 MED ORDER — DILTIAZEM LOAD VIA INFUSION
10.0000 mg | Freq: Once | INTRAVENOUS | Status: AC
  Administered 2022-05-23: 10 mg via INTRAVENOUS
  Filled 2022-05-23: qty 10

## 2022-05-23 MED ORDER — DILTIAZEM HCL-DEXTROSE 125-5 MG/125ML-% IV SOLN (PREMIX)
5.0000 mg/h | INTRAVENOUS | Status: DC
  Administered 2022-05-23 – 2022-05-24 (×2): 5 mg/h via INTRAVENOUS
  Filled 2022-05-23 (×2): qty 125

## 2022-05-23 MED ORDER — ENOXAPARIN SODIUM 100 MG/ML IJ SOSY
1.0000 mg/kg | PREFILLED_SYRINGE | Freq: Once | INTRAMUSCULAR | Status: AC
  Administered 2022-05-23: 92.5 mg via SUBCUTANEOUS
  Filled 2022-05-23: qty 0.93

## 2022-05-23 NOTE — ED Provider Triage Note (Cosign Needed Addendum)
Emergency Medicine Provider Triage Evaluation Note  Eddie Conway , a 62 y.o. male  was evaluated in triage.  Pt complains of vision changes and abdominal pain.  Patient is a type II diabetic currently on short and long-term insulin and has been out of his metformin for a month.  Patient states the last 3 days he has had decreased central vision.  Abdominal pain has been present chronically but has progressively gotten worse over the past few days and is generalized.  Patient endorsed polyuria and polydipsia.  Patient got a call from the [place] saying his A1c was extremely elevated and that he had to go to the emergency room.  Patient stated he has had 3 weeks of dysuria and a frontal headache.  Patient stated he has been short of breath past few days as well with exertion.  Patient denied chest pain, fever/chills, nausea/vomiting, changes in sensation/motor skills  Review of Systems  Positive: See HPI Negative: See HPI  Physical Exam  There were no vitals taken for this visit. Gen:   Awake, no distress   Resp:  Normal effort  MSK:   Moves extremities without difficulty  Other:  Generalized abdominal tenderness, no peritoneal signs noted, bilateral eyes PERRL, decreased sensation right hand but states this is normal from his previous stroke  Medical Decision Making  Medically screening exam initiated at 7:25 PM.  Appropriate orders placed.  Eddie Conway was informed that the remainder of the evaluation will be completed by another provider, this initial triage assessment does not replace that evaluation, and the importance of remaining in the ED until their evaluation is complete.  Workup initiated, patient CBG read high and charge nurse was notified for bed, patient stable at this time    Eddie Conway 05/23/22 1942

## 2022-05-23 NOTE — Plan of Care (Incomplete)
62 year old male with history of paroxysmal A-fib on Eliquis, insulin-dependent diabetes, hypertension, hyperlipidemia, history of large right MCA stroke in 2009 with residual left hemiparesis, seizure disorder presents to the ED with complaints of blurry vision, polyuria/dysuria, polydipsia, abdominal pain, headache, and shortness of breath.  Patient tachycardic to the 110s on arrival to the ED but afebrile.  Noted to be in A-fib with RVR.  Blood pressure 159/102 on arrival.  Labs showing no leukocytosis or anemia, sodium 125, chloride 90, bicarb 17, anion gap 18, glucose 799, BUN 24, creatinine 1.5 (baseline 1.2-1.4), troponin negative, beta hydroxybutyric acid pending, VBG with pH 7.38.  UA with >500 glucose, 20 ketones, negative nitrite, small amount of leukocytes, and microscopy showing 11-20 RBCs, 6-10 WBCs, and rare bacteria.  CT head negative for acute finding.  Chest x-ray negative for acute abnormality. Patient was started on IV Cardizem drip, was given full dose Lovenox, and started on IV insulin and IV fluids per DKA protocol.  IV potassium and 1.8 L LR bolus given.  TRH called to admit.   EKG showing A-fib with RVR.  T wave inversions in lateral leads seen on prior EKG as well.   Echo done 04/06/2021 showing EF 60 to 65%.

## 2022-05-23 NOTE — ED Notes (Signed)
Lab called critical glucose result, 799.  Reported to Dr. Sabra Heck and primary RN

## 2022-05-23 NOTE — ED Triage Notes (Addendum)
Patient reports right side abdominal pain today with dysuria " burning" , mild headache, blurred vision  and SOB . CBG= 600+ at triage .

## 2022-05-23 NOTE — ED Provider Notes (Signed)
[facility] Provider Note   CSN: [id number] Arrival date & time: 05/23/22  1921     History  Chief Complaint  Patient presents with   Abdominal Pain    Hyperglycemia    Eddie Conway is a 62 y.o. male.   Abdominal Pain  Patient is a generally poorly controlled diabetic who also has a history of a very high A1c at about 11.5, he is a patient of the [place], the patient was told that his A1c was extremely elevated and that he should go to the emergency room.  He states he had 3 weeks of frequent urination dysuria and headache has been feeling increasing short of breath the last few days with exertion.  He has run out of his metformin for about a month.  He is currently on his short and long-term insulin.  He does complain of right-sided abdominal discomfort and some blurry vision in general.  Review of the medical record shows that the patient was admitted to the hospital about a month and a half ago with what appeared to be severe hyperglycemia, encephalopathy, A-fib, he has a history of A-fib intermittently and has been on Eliquis though it is unclear if he is taking this medication regularly  The patient complains of several days of reading "high" on his glucometer.  This despite taking his short acting insulin.  He is out of his long-acting insulin and out of his Eliquis.    Home Medications Prior to Admission medications   Medication Sig Start Date End Date Taking? Authorizing Provider  acetaminophen (TYLENOL) 500 MG tablet Take 1,000 mg by mouth every 6 (six) hours as needed for moderate pain.   Yes [provider]  apixaban (ELIQUIS) 5 MG TABS tablet Take 1 tablet (5 mg total) by mouth 2 (two) times daily. 04/09/21 05/23/23 Yes Alma Friendly, MD  atorvastatin (LIPITOR) 40 MG tablet Take 40 mg by mouth daily.   Yes [provider]  cholecalciferol (VITAMIN D3) 25 MCG (1000 UNIT) tablet Take 1,000 Units by mouth  daily.   Yes [provider]  diltiazem (CARDIZEM CD) 180 MG 24 hr capsule Take 1 capsule (180 mg total) by mouth daily. 04/10/21 05/23/23 Yes Alma Friendly, MD  insulin aspart (NOVOLOG) 100 UNIT/ML FlexPen Inject 6 Units into the skin 3 (three) times daily with meals. Patient taking differently: Inject 6 Units into the skin daily with breakfast. 04/09/21  Yes Alma Friendly, MD  insulin glargine (LANTUS) 100 UNIT/ML Solostar Pen Inject 20 Units into the skin at bedtime. 04/09/21  Yes Alma Friendly, MD  levETIRAcetam (KEPPRA) 750 MG tablet Take 750 mg by mouth 2 (two) times daily.   Yes [provider]  topiramate (TOPAMAX) 100 MG tablet Take 100 mg by mouth in the morning, at noon, and at bedtime.   Yes [provider]  vitamin B-12 (CYANOCOBALAMIN) 500 MCG tablet Take 500 mcg by mouth daily.   Yes [provider]  blood glucose meter kit and supplies KIT Dispense based on patient and insurance preference. Use up to four times daily as directed. 04/09/21   Alma Friendly, MD      Allergies    Patient has no known allergies.    Review of Systems   Review of Systems  Gastrointestinal:  Positive for abdominal pain.  All other systems reviewed and are negative.   Physical Exam Updated Vital Signs BP (!) 159/102   Pulse (!) 115  Temp 98.2 F (36.8 C)   Resp 17   Wt 93.4 kg   SpO2 96%   BMI 27.94 kg/m  Physical Exam Vitals and nursing note reviewed.  Constitutional:      General: He is in acute distress.     Appearance: He is well-developed. He is ill-appearing.  HENT:     Head: Normocephalic and atraumatic.     Mouth/Throat:     Mouth: Mucous membranes are dry.     Pharynx: No oropharyngeal exudate.  Eyes:     General: No scleral icterus.       Right eye: No discharge.        Left eye: No discharge.     Conjunctiva/sclera: Conjunctivae normal.     Pupils: Pupils are equal, round, and reactive to light.  Neck:      Thyroid: No thyromegaly.     Vascular: No JVD.  Cardiovascular:     Rate and Rhythm: Tachycardia present. Rhythm irregular.     Heart sounds: Normal heart sounds. No murmur heard.    No friction rub. No gallop.  Pulmonary:     Effort: Pulmonary effort is normal. No respiratory distress.     Breath sounds: Normal breath sounds. No wheezing or rales.  Abdominal:     General: Bowel sounds are normal. There is no distension.     Palpations: Abdomen is soft. There is no mass.     Tenderness: There is abdominal tenderness.     Comments: Nonspecific periumbilical  Genitourinary:    Comments: Uncircumcised, fungal rash to the tip of the penis and foreskin Musculoskeletal:        General: No tenderness. Normal range of motion.     Cervical back: Normal range of motion and neck supple.     Right lower leg: No edema.     Left lower leg: No edema.  Lymphadenopathy:     Cervical: No cervical adenopathy.  Skin:    General: Skin is warm and dry.     Findings: No erythema or rash.  Neurological:     General: No focal deficit present.     Mental Status: He is alert.     Coordination: Coordination normal.  Psychiatric:        Behavior: Behavior normal.     ED Results / Procedures / Treatments   Labs (all labs ordered are listed, but only abnormal results are displayed) Labs Reviewed  BASIC METABOLIC PANEL - Abnormal; Notable for the following components:      Result Value   Sodium 125 (*)    Chloride 90 (*)    CO2 17 (*)    Glucose, Bld 799 (*)    BUN 24 (*)    Creatinine, Ser 1.59 (*)    GFR, Estimated 49 (*)    Anion gap 18 (*)    All other components within normal limits  URINALYSIS, ROUTINE W REFLEX MICROSCOPIC - Abnormal; Notable for the following components:   Color, Urine STRAW (*)    Glucose, UA >=500 (*)    Hgb urine dipstick SMALL (*)    Ketones, ur 20 (*)    Leukocytes,Ua SMALL (*)    Bacteria, UA RARE (*)    All other components within normal limits  CBG  MONITORING, ED - Abnormal; Notable for the following components:   Glucose-Capillary >600 (*)    All other components within normal limits  CBG MONITORING, ED - Abnormal; Notable for the following components:   Glucose-Capillary >600 (*)  All other components within normal limits  CBG MONITORING, ED - Abnormal; Notable for the following components:   Glucose-Capillary >600 (*)    All other components within normal limits  I-STAT VENOUS BLOOD GAS, ED - Abnormal; Notable for the following components:   pCO2, Ven 37.1 (*)    Sodium 128 (*)    Calcium, Ion 1.13 (*)    All other components within normal limits  I-STAT CHEM 8, ED - Abnormal; Notable for the following components:   Sodium 129 (*)    Chloride 92 (*)    BUN 26 (*)    Creatinine, Ser 1.40 (*)    Glucose, Bld >700 (*)    Calcium, Ion 1.05 (*)    TCO2 21 (*)    All other components within normal limits  CBC WITH DIFFERENTIAL/PLATELET  BETA-HYDROXYBUTYRIC ACID  BETA-HYDROXYBUTYRIC ACID  TROPONIN I (HIGH SENSITIVITY)  TROPONIN I (HIGH SENSITIVITY)    EKG EKG Interpretation  Date/Time:  Tuesday May 23 2022 19:36:29 EDT Ventricular Rate:  130 PR Interval:    QRS Duration: 70 QT Interval:  334 QTC Calculation: 491 R Axis:   47 Text Interpretation: Atrial fibrillation with rapid ventricular response Nonspecific ST and T wave abnormality Abnormal ECG When compared with ECG of 05-Apr-2021 13:18, PREVIOUS ECG IS PRESENT since 1/31, afib faster Confirmed by Noemi Chapel [phone]) on 05/23/2022 7:47:07 PM  Radiology DG Chest 2 View  Result Date: 05/23/2022 CLINICAL DATA:  Shortness of breath. EXAM: CHEST - 2 VIEW COMPARISON:  Radiograph 04/04/2021, CT 02/06/2020 FINDINGS: Heart size upper normal. Stable mediastinal contours. The lungs are clear. Pulmonary vasculature is normal. No consolidation, pleural effusion, or pneumothorax. No acute osseous abnormalities are seen. IMPRESSION: No acute abnormality. Electronically Signed    By: Keith Rake M.D.   On: 05/23/2022 20:39   CT Head Wo Contrast  Result Date: 05/23/2022 CLINICAL DATA:  Vision changes.  History of stroke. EXAM: CT HEAD WITHOUT CONTRAST TECHNIQUE: Contiguous axial images were obtained from the base of the skull through the vertex without intravenous contrast. RADIATION DOSE REDUCTION: This exam was performed according to the departmental dose-optimization program which includes automated exposure control, adjustment of the mA and/or kV according to patient size and/or use of iterative reconstruction technique. COMPARISON:  Brain MRI 04/07/2021 FINDINGS: Brain: Dense encephalomalacia from remote right MCA territory infarct. Encephalomalacia with calcification along a abandoned left frontal ventriculostomy tract. No evidence of acute infarct, hemorrhage, hydrocephalus, or mass. Vascular: No hyperdense vessel or unexpected calcification. Skull: Unremarkable remote right craniotomy. Sinuses/Orbits: No acute finding. IMPRESSION: No acute or interval finding. Remote right MCA territory infarct. Electronically Signed   By: Jorje Guild M.D.   On: 05/23/2022 20:24    Procedures .Critical Care  Performed by: Noemi Chapel, MD Authorized by: Noemi Chapel, MD   Critical care provider statement:    Critical care time (minutes):  45   Critical care time was exclusive of:  Separately billable procedures and treating other patients and teaching time   Critical care was necessary to treat or prevent imminent or life-threatening deterioration of the following conditions:  Metabolic crisis, endocrine crisis and cardiac failure   Critical care was time spent personally by me on the following activities:  Development of treatment plan with patient or surrogate, discussions with consultants, evaluation of patient's response to treatment, examination of patient, obtaining history from patient or surrogate, review of old charts, re-evaluation of patient's condition, pulse  oximetry, ordering and review of radiographic studies, ordering and review of  laboratory studies and ordering and performing treatments and interventions   I assumed direction of critical care for this patient from another provider in my specialty: no     Care discussed with: admitting provider   Comments:           Medications Ordered in ED Medications  insulin regular, human (MYXREDLIN) 100 units/ 100 mL infusion (15 Units/hr Intravenous New Bag/Given 05/23/22 2141)  lactated ringers infusion (has no administration in time range)  dextrose 5 % in lactated ringers infusion (has no administration in time range)  dextrose 50 % solution 0-50 mL (has no administration in time range)  diltiazem (CARDIZEM) 1 mg/mL load via infusion 10 mg (10 mg Intravenous Bolus from Bag 05/23/22 2135)    And  diltiazem (CARDIZEM) 125 mg in dextrose 5% 125 mL (1 mg/mL) infusion (5 mg/hr Intravenous New Bag/Given 05/23/22 2129)  potassium chloride 10 mEq in 100 mL IVPB (has no administration in time range)  potassium chloride 10 mEq in 100 mL IVPB (has no administration in time range)  lactated ringers bolus 1,868 mL (1,868 mLs Intravenous New Bag/Given 05/23/22 2051)  enoxaparin (LOVENOX) injection 92.5 mg (92.5 mg Subcutaneous Given 05/23/22 2133)    ED Course/ Medical Decision Making/ A&P                             Medical Decision Making Amount and/or Complexity of Data Reviewed Labs: ordered.  Risk Prescription drug management. Decision regarding hospitalization.   Patient is ill with what appears to be severe hyperglycemia and atrial fibrillation, he has been high for several days, [facility] told him to come to the hospital for evaluation and possible admission  This patient presents to the ED for concern of hyperglycemia, this involves an extensive number of treatment options, and is a complaint that carries with it a high risk of complications and morbidity.  The differential diagnosis includes  hyperglycemia, DKA, could be related to infection or noncompliance with medications   Co morbidities that complicate the patient evaluation  Uncontrolled diabetes   Additional history obtained:  Additional history obtained from electronic medical record External records from outside source obtained and reviewed including prior evaluations and admissions to outside hospitals, the patient is a patient of the [place], he has paroxysmal atrial fibrillation   Lab Tests:  I Ordered, and personally interpreted labs.  The pertinent results include: Severe hyperglycemia with an anion gap acidosis consistent with DKA   Imaging Studies ordered:  I ordered imaging studies including chest x-ray I independently visualized and interpreted imaging which showed no acute findings in the lungs or the heart, CT scan of the brain without acute findings, prior ischemic area seen. I agree with the radiologist interpretation   Cardiac Monitoring: / EKG:  The patient was maintained on a cardiac monitor.  I personally viewed and interpreted the cardiac monitored which showed an underlying rhythm of: Atrial fibrillation, rapid between 120 and 140 bpm   Consultations Obtained:  I requested consultation with the hospitalist Dr. Marlowe Sax,  and discussed lab and imaging findings as well as pertinent plan - they recommend: Admission to the hospital, they will continue treatment for the patient's DKA as well as A-fib   Problem List / ED Course / Critical interventions / Medication management  Due to the patient's A-fib I give a shot of Lovenox, due to the rapid rate I gave diltiazem with a drip, insulin drip started and potassium for  hypokalemia in the presence of DKA, anion gap acidosis present I have reviewed the patients home medicines and have made adjustments as needed   Social Determinants of Health:  Uncontrolled diabetes, noncompliant with medicines   Test / Admission - Considered:  Admit to higher  level of care         Final Clinical Impression(s) / ED Diagnoses Final diagnoses:  Diabetic ketoacidosis without coma associated with type 1 diabetes mellitus ([place])      Noemi Chapel, MD 05/23/22 2220

## 2022-05-24 ENCOUNTER — Encounter (HOSPITAL_COMMUNITY): Payer: Self-pay | Admitting: Internal Medicine

## 2022-05-24 DIAGNOSIS — R519 Headache, unspecified: Secondary | ICD-10-CM | POA: Insufficient documentation

## 2022-05-24 DIAGNOSIS — R7989 Other specified abnormal findings of blood chemistry: Secondary | ICD-10-CM | POA: Insufficient documentation

## 2022-05-24 DIAGNOSIS — I10 Essential (primary) hypertension: Secondary | ICD-10-CM | POA: Diagnosis not present

## 2022-05-24 DIAGNOSIS — I480 Paroxysmal atrial fibrillation: Secondary | ICD-10-CM | POA: Diagnosis present

## 2022-05-24 DIAGNOSIS — E1110 Type 2 diabetes mellitus with ketoacidosis without coma: Secondary | ICD-10-CM | POA: Diagnosis not present

## 2022-05-24 DIAGNOSIS — E876 Hypokalemia: Secondary | ICD-10-CM | POA: Diagnosis present

## 2022-05-24 DIAGNOSIS — B3742 Candidal balanitis: Secondary | ICD-10-CM | POA: Diagnosis present

## 2022-05-24 DIAGNOSIS — R079 Chest pain, unspecified: Secondary | ICD-10-CM | POA: Insufficient documentation

## 2022-05-24 LAB — TROPONIN I (HIGH SENSITIVITY)
Troponin I (High Sensitivity): 14 ng/L (ref <18)
Troponin I (High Sensitivity): 20 ng/L — ABNORMAL HIGH (ref <18)

## 2022-05-24 LAB — HIV ANTIBODY (ROUTINE TESTING W REFLEX): HIV Screen 4th Generation wRfx: NONREACTIVE

## 2022-05-24 LAB — GLUCOSE, CAPILLARY
Glucose-Capillary: 157 mg/dL — ABNORMAL HIGH (ref 70–99)
Glucose-Capillary: 190 mg/dL — ABNORMAL HIGH (ref 70–99)
Glucose-Capillary: 185 mg/dL — ABNORMAL HIGH (ref 70–99)
Glucose-Capillary: 198 mg/dL — ABNORMAL HIGH (ref 70–99)
Glucose-Capillary: 342 mg/dL — ABNORMAL HIGH (ref 70–99)
Glucose-Capillary: 233 mg/dL — ABNORMAL HIGH (ref 70–99)
Glucose-Capillary: 163 mg/dL — ABNORMAL HIGH (ref 70–99)
Glucose-Capillary: 193 mg/dL — ABNORMAL HIGH (ref 70–99)
Glucose-Capillary: 370 mg/dL — ABNORMAL HIGH (ref 70–99)
Glucose-Capillary: 306 mg/dL — ABNORMAL HIGH (ref 70–99)

## 2022-05-24 LAB — BASIC METABOLIC PANEL
Anion gap: 13 (ref 5–15)
Anion gap: 20 — ABNORMAL HIGH (ref 5–15)
Anion gap: 12 (ref 5–15)
Anion gap: 11 (ref 5–15)
BUN: 16 mg/dL (ref 8–23)
BUN: 14 mg/dL (ref 8–23)
BUN: 15 mg/dL (ref 8–23)
BUN: 10 mg/dL (ref 8–23)
CO2: 16 mmol/L — ABNORMAL LOW (ref 22–32)
CO2: 20 mmol/L — ABNORMAL LOW (ref 22–32)
CO2: 22 mmol/L (ref 22–32)
CO2: 21 mmol/L — ABNORMAL LOW (ref 22–32)
Calcium: 7.2 mg/dL — ABNORMAL LOW (ref 8.9–10.3)
Calcium: 8.9 mg/dL (ref 8.9–10.3)
Calcium: 8.5 mg/dL — ABNORMAL LOW (ref 8.9–10.3)
Calcium: 8.7 mg/dL — ABNORMAL LOW (ref 8.9–10.3)
Chloride: 100 mmol/L (ref 98–111)
Chloride: 97 mmol/L — ABNORMAL LOW (ref 98–111)
Chloride: 100 mmol/L (ref 98–111)
Chloride: 101 mmol/L (ref 98–111)
Creatinine, Ser: 1.15 mg/dL (ref 0.61–1.24)
Creatinine, Ser: 1.11 mg/dL (ref 0.61–1.24)
Creatinine, Ser: 1.08 mg/dL (ref 0.61–1.24)
Creatinine, Ser: 1.26 mg/dL — ABNORMAL HIGH (ref 0.61–1.24)
GFR, Estimated: >60 mL/min (ref >60)
GFR, Estimated: >60 mL/min (ref >60)
GFR, Estimated: >60 mL/min (ref >60)
GFR, Estimated: >60 mL/min (ref >60)
Glucose, Bld: 402 mg/dL — ABNORMAL HIGH (ref 70–99)
Glucose, Bld: 144 mg/dL — ABNORMAL HIGH (ref 70–99)
Glucose, Bld: 180 mg/dL — ABNORMAL HIGH (ref 70–99)
Glucose, Bld: 307 mg/dL — ABNORMAL HIGH (ref 70–99)
Potassium: 2.7 mmol/L — CL (ref 3.5–5.1)
Potassium: 2.8 mmol/L — ABNORMAL LOW (ref 3.5–5.1)
Potassium: 3.1 mmol/L — ABNORMAL LOW (ref 3.5–5.1)
Potassium: 3.8 mmol/L (ref 3.5–5.1)
Sodium: 129 mmol/L — ABNORMAL LOW (ref 135–145)
Sodium: 137 mmol/L (ref 135–145)
Sodium: 134 mmol/L — ABNORMAL LOW (ref 135–145)
Sodium: 133 mmol/L — ABNORMAL LOW (ref 135–145)

## 2022-05-24 LAB — MAGNESIUM: Magnesium: 1.8 mg/dL (ref 1.7–2.4)

## 2022-05-24 LAB — CBG MONITORING, ED
Glucose-Capillary: 185 mg/dL — ABNORMAL HIGH (ref 70–99)
Glucose-Capillary: 166 mg/dL — ABNORMAL HIGH (ref 70–99)

## 2022-05-24 LAB — BETA-HYDROXYBUTYRIC ACID
Beta-Hydroxybutyric Acid: 0.69 mmol/L — ABNORMAL HIGH (ref 0.05–0.27)
Beta-Hydroxybutyric Acid: 0.64 mmol/L — ABNORMAL HIGH (ref 0.05–0.27)

## 2022-05-24 MED ORDER — INSULIN REGULAR(HUMAN) IN NACL 100-0.9 UT/100ML-% IV SOLN: INTRAVENOUS | Status: DC

## 2022-05-24 MED ORDER — DILTIAZEM HCL ER COATED BEADS 180 MG PO CP24
180.0000 mg | ORAL_CAPSULE | Freq: Every day | ORAL | Status: DC
  Administered 2022-05-24 – 2022-05-25 (×2): 180 mg via ORAL
  Filled 2022-05-24 (×2): qty 1

## 2022-05-24 MED ORDER — INSULIN ASPART 100 UNIT/ML IJ SOLN
3.0000 [IU] | Freq: Three times a day (TID) | INTRAMUSCULAR | Status: DC
  Administered 2022-05-24 – 2022-05-25 (×4): 3 [IU] via SUBCUTANEOUS

## 2022-05-24 MED ORDER — POTASSIUM CHLORIDE 10 MEQ/100ML IV SOLN
10.0000 meq | INTRAVENOUS | Status: AC
  Administered 2022-05-24 (×2): 10 meq via INTRAVENOUS
  Filled 2022-05-24 (×2): qty 100

## 2022-05-24 MED ORDER — ACETAMINOPHEN 650 MG RE SUPP: 650.0000 mg | Freq: Four times a day (QID) | RECTAL | Status: DC | PRN

## 2022-05-24 MED ORDER — POTASSIUM CHLORIDE CRYS ER 20 MEQ PO TBCR
40.0000 meq | EXTENDED_RELEASE_TABLET | Freq: Three times a day (TID) | ORAL | Status: DC
  Administered 2022-05-24 (×2): 40 meq via ORAL
  Filled 2022-05-24 (×2): qty 2

## 2022-05-24 MED ORDER — INSULIN ASPART 100 UNIT/ML IJ SOLN
0.0000 [IU] | Freq: Three times a day (TID) | INTRAMUSCULAR | Status: DC
  Administered 2022-05-24: 3 [IU] via SUBCUTANEOUS
  Administered 2022-05-24: 15 [IU] via SUBCUTANEOUS
  Administered 2022-05-25: 2 [IU] via SUBCUTANEOUS
  Administered 2022-05-25: 8 [IU] via SUBCUTANEOUS
  Administered 2022-05-25: 11 [IU] via SUBCUTANEOUS

## 2022-05-24 MED ORDER — INFLUENZA VAC SPLIT QUAD 0.5 ML IM SUSY
0.5000 mL | PREFILLED_SYRINGE | INTRAMUSCULAR | Status: AC
  Administered 2022-05-25: 0.5 mL via INTRAMUSCULAR
  Filled 2022-05-24: qty 0.5

## 2022-05-24 MED ORDER — INSULIN ASPART 100 UNIT/ML IJ SOLN
0.0000 [IU] | Freq: Every day | INTRAMUSCULAR | Status: DC
  Administered 2022-05-24: 4 [IU] via SUBCUTANEOUS

## 2022-05-24 MED ORDER — POTASSIUM CHLORIDE CRYS ER 20 MEQ PO TBCR: 40.0000 meq | EXTENDED_RELEASE_TABLET | ORAL | Status: DC

## 2022-05-24 MED ORDER — CLOTRIMAZOLE 1 % EX CREA
TOPICAL_CREAM | Freq: Two times a day (BID) | CUTANEOUS | Status: DC
  Filled 2022-05-24 (×2): qty 15

## 2022-05-24 MED ORDER — ACETAMINOPHEN 325 MG PO TABS
650.0000 mg | ORAL_TABLET | Freq: Four times a day (QID) | ORAL | Status: DC | PRN
  Administered 2022-05-24 – 2022-05-25 (×2): 650 mg via ORAL
  Filled 2022-05-24 (×2): qty 2

## 2022-05-24 MED ORDER — ORAL CARE MOUTH RINSE: 15.0000 mL | OROMUCOSAL | Status: DC | PRN

## 2022-05-24 MED ORDER — INSULIN GLARGINE-YFGN 100 UNIT/ML ~~LOC~~ SOLN
20.0000 [IU] | Freq: Two times a day (BID) | SUBCUTANEOUS | Status: DC
  Administered 2022-05-24 – 2022-05-25 (×3): 20 [IU] via SUBCUTANEOUS
  Filled 2022-05-24 (×4): qty 0.2

## 2022-05-24 MED ORDER — MAGNESIUM SULFATE 2 GM/50ML IV SOLN
2.0000 g | INTRAVENOUS | Status: AC
  Administered 2022-05-24: 2 g via INTRAVENOUS
  Filled 2022-05-24: qty 50

## 2022-05-24 MED ORDER — APIXABAN 5 MG PO TABS
5.0000 mg | ORAL_TABLET | Freq: Two times a day (BID) | ORAL | Status: DC
  Administered 2022-05-24 – 2022-05-25 (×3): 5 mg via ORAL
  Filled 2022-05-24 (×3): qty 1

## 2022-05-24 MED ORDER — MAGNESIUM SULFATE 2 GM/50ML IV SOLN
2.0000 g | Freq: Once | INTRAVENOUS | Status: AC
  Administered 2022-05-24: 2 g via INTRAVENOUS
  Filled 2022-05-24: qty 50

## 2022-05-24 MED ORDER — POTASSIUM CHLORIDE 20 MEQ PO PACK
20.0000 meq | PACK | Freq: Once | ORAL | Status: AC
  Administered 2022-05-24: 20 meq via ORAL
  Filled 2022-05-24: qty 1

## 2022-05-24 MED ORDER — POTASSIUM CHLORIDE CRYS ER 20 MEQ PO TBCR
40.0000 meq | EXTENDED_RELEASE_TABLET | Freq: Once | ORAL | Status: AC
  Administered 2022-05-24: 40 meq via ORAL
  Filled 2022-05-24: qty 2

## 2022-05-24 NOTE — Progress Notes (Signed)
Progress Note   Patient: Eddie Conway [id number] DOB: 1960-09-11 DOA: 05/23/2022     0 DOS: the patient was seen and examined on 05/24/2022   Brief hospital course: Eddie Conway was admitted to the hospital with the working diagnosis of diabetes ketoacidosis.   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 He reported blurry vision, polyuria, polydipsia, headache, abdominal pain and dyspnea. At home is glucometer has been reading high for several weeks. At home he run out of insulin. He was evaluated by his primary care the day prior to hospitalization, his had blood work obtained, because abnormal results he was called and advised to come to the ED. On his initial physical examination his blood pressure was 147/108, HR 77, RR 16 and 02 saturation 97%, oral mucosa was dry, heart with S1 and S2 present and irregular, lungs with no wheezing or rales, abdomen with no distention and no lower extremity edema.   Na 125, K 3.6 CL 90, bicarbonate 17, glucose 799, bun 24, cr 1,59  High sensitive troponin 15  Wbc 6,7 hgb 14.6 plt 202  Urine analysis SG 1,021, negative protein, 6 to 10 wbc, > 500 glucose.   Head CT with no acute changes. Remote right MCA infarct.   Chest radiograph with no effusions or infiltrates. No cardiomegaly.   EKG 130 bpm, normal axis, normal qtc, atrial flutter with variable block, no significant ST segment changes, negative T wave V5 and V6.   Patient was placed on insulin drip for glucose control.  K supplementation for hypokalemia.   Assessment and Plan: * DKA (diabetic ketoacidosis) ([place]) T2DM with uncontrolled hyperglycemia.   Fasting glucose this am is 180 mg/dl Anion gap has closed at 12. Patient with no nausea or vomiting.   Plan to transition to sq insulin, at home patient has been on 20 units of long  acting insulin, his insulin rate this morning is 18 units.  Will start with 20 units bid of long acting insulin and will add insulin sliding scale for glucose cover and monitoring. Advance diet to carb modified.   Hypokalemia Hypomagnesemia. Hyponatremia.   Renal function with serum cr at 1,0 with K at 3,1 and serum bicarbonate at 22.  Na 134 and Mg at 1,8   Plan to continue Kcl correction with KCL 40 meq x3 and add 2 g mag sulfate. Follow up BMP this pm.  Hold on IV fluids.   Essential hypertension Continue blood pressure monitoring.  Resume oral diltiazem for atrial fibrillation rate control.   Paroxysmal atrial fibrillation with rapid ventricular response (H[facility]) Improved rate control with diltiazem infusion.  Plan to transition to oral diltiazem 180 mg po daily and continue anticoagulation with apixaban. Follow up telemetry monitoring.   History of right MCA stroke Continue with statin therapy, blood pressure control and anticoagulation for atrial fibrillation.   Seizures, controlled. Continue with keppra and topiramate.   Mixed hyperlipidemia Continue statin therapy.   Candidal balanitis Continue with topical clotrimazol.         Subjective: Patient is feeling better, no nausea or vomiting, no abdominal pain, he has been in IV insulin, IV diltiazem and IV fluids.   Physical Exam: Vitals:   05/24/22 0800 05/24/22 0830 05/24/22 0900 05/24/22 0930  BP: (!) 132/104 (!) 138/99  (!) 130/103  Pulse: 90 100    Resp: 12 20 (!)  21 18  Temp:      TempSrc:      SpO2: 99%     Weight:      Height:       Neurology awake and alert ENT with mild pallor, oral mucosa dry. Cardiovascular with S1 and S2 present, irregularly irregular with no gallops, rubs or murmurs No JVD No lower extremity edema Respiratory with no rales or wheezing, no rhonchi Abdomen with no distention, non tender and not distended.  Data Reviewed:    Family Communication: no family at the bedside    Disposition: Status is: Observation The patient remains OBS appropriate and will d/c before 2 midnights.  Planned Discharge Destination: Home      Author: Tawni Millers, MD 05/24/2022 10:12 AM  For on call review [email or web address].

## 2022-05-24 NOTE — Assessment & Plan Note (Signed)
Continue statin therapy.

## 2022-05-24 NOTE — Progress Notes (Signed)
[facility] stated that he is no longer taking apixaban. He is being treated for AF here. D/w Dr. Cathlean Sauer and we will resume his apixaban.  Onnie Boer, PharmD, BCIDP, AAHIVP, CPP Infectious Disease Pharmacist 05/24/2022 1:54 PM

## 2022-05-24 NOTE — Assessment & Plan Note (Signed)
Continue blood pressure monitoring.  Resume oral diltiazem for atrial fibrillation rate control.

## 2022-05-24 NOTE — Assessment & Plan Note (Signed)
Patient developed atrial fibrillation with RVR, he required IV diltiazem for rate control.  He was placed on oral diltiazem and successfully weaned off IV diltiazem.  Plan to continue anticoagulation with apixaban.

## 2022-05-24 NOTE — Inpatient Diabetes Management (Signed)
Inpatient Diabetes Program Recommendations  AACE/ADA: New Consensus Statement on Inpatient Glycemic Control (2015)  Target Ranges:  Prepandial:   less than 140 mg/dL      Peak postprandial:   less than 180 mg/dL (1-2 hours)      Critically ill patients:  140 - 180 mg/dL   Lab Results  Component Value Date   GLUCAP 163 (H) 05/24/2022   HGBA1C 11.9 (H) 04/05/2021   Diabetes history: DM2 Outpatient Diabetes medications: Lantus 20 units qd, Novolog 6 units ac lunch Current orders for Inpatient glycemic control: Semglee 20 units qd, Novolog 0-15 units tid correction  Inpatient Diabetes Program Recommendations:   DM coordinator spoke with patient prior to D/C on 04/09/21 regarding insulin management. Per note, patient ran out of Semglee insulin due to taking bid instead of qd. Please consider: -Add Novolog 5 units tid meal coverage if eats 50%  Thank you, Bethena Roys E. Tenna Lacko, RN, MSN, CDE  Diabetes Coordinator Inpatient Glycemic Control Team Team Pager [phone] (8am-5pm) 05/24/2022 2:35 PM

## 2022-05-24 NOTE — Assessment & Plan Note (Signed)
Continue with topical clotrimazol.

## 2022-05-24 NOTE — Assessment & Plan Note (Signed)
T2DM with uncontrolled hyperglycemia.   Patient was treated with insulin drip, glucose improved and anion gap closes.  Patient was transitioned to sq insulin with good toleration.   His fasting glucose today is 121 mg/dl.  Patient will continue basal insulin 15 units bid along with pre meal short acting insulin 6 units.  Continue close follow up on capillary glucose monitoring at home.

## 2022-05-24 NOTE — H&P (Signed)
History and Physical    Eddie Conway [id number] DOB: May 08, 1960 DOA: 05/23/2022  PCP: [facility]  Patient coming from: Home  Chief Complaint: High blood sugar  HPI: Eddie Conway is a 62 y.o. 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  On arrival to the ED, patient noted to be in A-fib with RVR with rate in the 130s.  Afebrile.  Blood pressure 159/102 on arrival.  Labs showing no leukocytosis or anemia, sodium 125, chloride 90, bicarb 17, anion gap 18, glucose 799, BUN 24, creatinine 1.5 (baseline 1.2-1.4), troponin negative, beta hydroxybutyric acid pending, VBG with pH 7.38.  UA with >500 glucose, 20 ketones, negative nitrite, small amount of leukocytes, and microscopy showing 11-20 RBCs, 6-10 WBCs, and rare bacteria.  CT head negative for acute finding.  Chest x-ray negative for acute abnormality. Patient was started on IV Cardizem drip, was given full dose Lovenox, and started on IV insulin and IV fluids per DKA protocol.  IV potassium and 1.8 L LR bolus given.  TRH called to admit.   Patient states he has a Dexcom CGM and his blood sugar has been reading "high" for several weeks.  He is endorsing polydipsia, polyuria, frontal headaches, and blurry vision.  States he is supposed to be taking 20 units of his long-acting insulin at bedtime but did not understand instructions and was accidentally taking it both at lunchtime and at bedtime.  As such he ran out it earlier than usual at the beginning of this month.  He is taking NovoLog 6 units with breakfast and was not taking it during lunch or dinner time as he was previously taking his long-acting insulin during these times but  since he ran out of the long-acting insulin he has been using NovoLog sliding scale with lunch and dinner.  He is also concerned that he might be consuming too much sugar as his wife recently bought water flavoring packets and iced tea which tastes very sweet.  Patient states he went to see his PCP at the [facility] yesterday and had lab work done after which his doctor called him and advised him to come into the emergency room immediately.  He is also endorsing intermittent chest pain and shortness of breath.  Denies any chest pain at present.  Also reports pain and burning at the tip of his penis for several weeks and states his PCP evaluated him yesterday and prescribed a pill and a powder.  No other complaints.  Denies nausea, vomiting, or abdominal pain.  Review of Systems:  Review of Systems  All other systems reviewed and are negative.   Past Medical History:  Diagnosis Date   Diabetes mellitus without complication ([place])    Hyperlipemia    Stroke [facility])     Past Surgical History:  Procedure Laterality Date   BRAIN SURGERY       reports that he has never smoked. He has never used smokeless tobacco. He reports that he does not currently use alcohol. He reports that he does not currently use drugs.  No Known Allergies  Family History  Problem Relation Age of Onset   Diabetes Mother    Diabetes Father    Heart failure Father    Hypertension Father     Prior to Admission  medications   Medication Sig Start Date End Date Taking? Authorizing Provider  acetaminophen (TYLENOL) 500 MG tablet Take 1,000 mg by mouth every 6 (six) hours as needed for moderate pain.   Yes [provider]  atorvastatin (LIPITOR) 40 MG tablet Take 40 mg by mouth daily.   Yes [provider]  cholecalciferol (VITAMIN D3) 25 MCG (1000 UNIT) tablet Take 1,000 Units by mouth daily.   Yes [provider]  diltiazem (CARDIZEM CD) 180 MG 24 hr capsule Take 1 capsule (180 mg total) by mouth daily.  04/10/21 05/23/23 Yes Alma Friendly, MD  insulin aspart (NOVOLOG) 100 UNIT/ML FlexPen Inject 6 Units into the skin 3 (three) times daily with meals. Patient taking differently: Inject 6 Units into the skin daily with breakfast. 04/09/21  Yes Alma Friendly, MD  insulin glargine (LANTUS) 100 UNIT/ML Solostar Pen Inject 20 Units into the skin at bedtime. 04/09/21  Yes Alma Friendly, MD  levETIRAcetam (KEPPRA) 750 MG tablet Take 750 mg by mouth 2 (two) times daily.   Yes [provider]  topiramate (TOPAMAX) 100 MG tablet Take 100 mg by mouth in the morning, at noon, and at bedtime.   Yes [provider]  vitamin B-12 (CYANOCOBALAMIN) 500 MCG tablet Take 500 mcg by mouth daily.   Yes [provider]  apixaban (ELIQUIS) 5 MG TABS tablet Take 1 tablet (5 mg total) by mouth 2 (two) times daily. Patient not taking: Reported on 05/23/2022 04/09/21 05/23/23  Alma Friendly, MD  blood glucose meter kit and supplies KIT Dispense based on patient and insurance preference. Use up to four times daily as directed. 04/09/21   Alma Friendly, MD    Physical Exam: Vitals:   05/23/22 2002 05/23/22 2315 05/24/22 0000 05/24/22 0014  BP:  (!) 147/108 (!) 158/110   Pulse:  77 (!) 56   Resp:  10 16   Temp:    98 F (36.7 C)  TempSrc:    Oral  SpO2:  100% 97%   Weight: 93.4 kg       Physical Exam Vitals reviewed. Exam conducted with a chaperone present.  Constitutional:      General: He is not in acute distress. HENT:     Head: Normocephalic and atraumatic.     Mouth/Throat:     Mouth: Mucous membranes are dry.  Eyes:     Extraocular Movements: Extraocular movements intact.  Cardiovascular:     Rate and Rhythm: Normal rate. Rhythm irregular.     Pulses: Normal pulses.  Pulmonary:     Effort: Pulmonary effort is normal. No respiratory distress.     Breath sounds: Normal breath sounds. No wheezing or rales.  Abdominal:     General: Bowel sounds are normal.  There is no distension.     Palpations: Abdomen is soft.     Tenderness: There is no abdominal tenderness.  Genitourinary:    Comments: Chaperone present at bedside (patient's ED nurse) Uncircumscribed, white curd-like exudate noted at the tip of the penis/foreskin Musculoskeletal:     Cervical back: Normal range of motion.     Right lower leg: No edema.     Left lower leg: No edema.  Skin:    General: Skin is warm and dry.  Neurological:     General: No focal deficit present.     Mental Status: He is alert and oriented to person, place, and time.     Labs on Admission: I have personally reviewed  following labs and imaging studies  CBC: Recent Labs  Lab 05/23/22 1941 05/23/22 2111 05/23/22 2122  WBC 6.7  --   --   NEUTROABS 4.7  --   --   HGB 14.6 16.7 16.7  HCT 44.6 49.0 49.0  MCV 88.0  --   --   PLT 202  --   --    Basic Metabolic Panel: Recent Labs  Lab 05/23/22 1941 05/23/22 2111 05/23/22 2122 05/24/22 0021  NA 125* 128* 129* 129*  K 3.6 3.6 3.6 2.7*  CL 90*  --  92* 100  CO2 17*  --   --  16*  GLUCOSE 799*  --  >700* 402*  BUN 24*  --  26* 16  CREATININE 1.59*  --  1.40* 1.15  CALCIUM 9.0  --   --  7.2*   GFR: CrCl cannot be calculated (Unknown ideal weight.). Liver Function Tests: No results for input(s): "AST", "ALT", "ALKPHOS", "BILITOT", "PROT", "ALBUMIN" in the last 168 hours. No results for input(s): "LIPASE", "AMYLASE" in the last 168 hours. No results for input(s): "AMMONIA" in the last 168 hours. Coagulation Profile: No results for input(s): "INR", "PROTIME" in the last 168 hours. Cardiac Enzymes: No results for input(s): "CKTOTAL", "CKMB", "CKMBINDEX", "TROPONINI" in the last 168 hours. BNP (last 3 results) No results for input(s): "PROBNP" in the last 8760 hours. HbA1C: No results for input(s): "HGBA1C" in the last 72 hours. CBG: Recent Labs  Lab 05/23/22 2133 05/23/22 2218 05/23/22 2252 05/23/22 2348 05/24/22 0120  GLUCAP >600*  >600* 440* 317* 185*   Lipid Profile: No results for input(s): "CHOL", "HDL", "LDLCALC", "TRIG", "CHOLHDL", "LDLDIRECT" in the last 72 hours. Thyroid Function Tests: No results for input(s): "TSH", "T4TOTAL", "FREET4", "T3FREE", "THYROIDAB" in the last 72 hours. Anemia Panel: No results for input(s): "VITAMINB12", "FOLATE", "FERRITIN", "TIBC", "IRON", "RETICCTPCT" in the last 72 hours. Urine analysis:    Component Value Date/Time   COLORURINE STRAW (A) 05/23/2022 2107   APPEARANCEUR CLEAR 05/23/2022 2107   LABSPEC 1.021 05/23/2022 2107   PHURINE 6.0 05/23/2022 2107   GLUCOSEU >=500 (A) 05/23/2022 2107   HGBUR SMALL (A) 05/23/2022 2107   BILIRUBINUR NEGATIVE 05/23/2022 2107   KETONESUR 20 (A) 05/23/2022 2107   PROTEINUR NEGATIVE 05/23/2022 2107   NITRITE NEGATIVE 05/23/2022 2107   LEUKOCYTESUR SMALL (A) 05/23/2022 2107    Radiological Exams on Admission: DG Chest 2 View  Result Date: 05/23/2022 CLINICAL DATA:  Shortness of breath. EXAM: CHEST - 2 VIEW COMPARISON:  Radiograph 04/04/2021, CT 02/06/2020 FINDINGS: Heart size upper normal. Stable mediastinal contours. The lungs are clear. Pulmonary vasculature is normal. No consolidation, pleural effusion, or pneumothorax. No acute osseous abnormalities are seen. IMPRESSION: No acute abnormality. Electronically Signed   By: Keith Rake M.D.   On: 05/23/2022 20:39   CT Head Wo Contrast  Result Date: 05/23/2022 CLINICAL DATA:  Vision changes.  History of stroke. EXAM: CT HEAD WITHOUT CONTRAST TECHNIQUE: Contiguous axial images were obtained from the base of the skull through the vertex without intravenous contrast. RADIATION DOSE REDUCTION: This exam was performed according to the departmental dose-optimization program which includes automated exposure control, adjustment of the mA and/or kV according to patient size and/or use of iterative reconstruction technique. COMPARISON:  Brain MRI 04/07/2021 FINDINGS: Brain: Dense encephalomalacia  from remote right MCA territory infarct. Encephalomalacia with calcification along a abandoned left frontal ventriculostomy tract. No evidence of acute infarct, hemorrhage, hydrocephalus, or mass. Vascular: No hyperdense vessel or unexpected calcification. Skull: Unremarkable remote  right craniotomy. Sinuses/Orbits: No acute finding. IMPRESSION: No acute or interval finding. Remote right MCA territory infarct. Electronically Signed   By: Jorje Guild M.D.   On: 05/23/2022 20:24    EKG: Independently reviewed. A-fib with RVR. T wave inversions in lateral leads seen on prior EKG as well.   Assessment and Plan  DKA Poorly controlled insulin-dependent diabetes Patient presenting with complaints of polydipsia, polyuria, and blurry vision.  Noted to be in DKA with glucose 799, bicarb 17, anion gap 18, UA with 20 ketones, beta hydroxybutyrate acid 0.69, VBG with normal pH.  Last A1c in the chart from January 2023 was 11.9.  Insulin nonadherence likely contributing as he ran out of his home long-acting insulin several weeks ago.  Also consuming sweetened beverages at home. Will continue insulin drip and IV fluids per DKA protocol.  Keep n.p.o., repeat A1c, BMP every 4 hours.  Paroxysmal A-fib with RVR Likely precipitated by DKA.  Rate initially in the 130s, now improved to 80s after Cardizem drip but remains in A-fib.  Continue cardiac monitoring and Cardizem drip.  Continue Eliquis after pharmacy med rec is done.  Pseudohyponatremia Sodium within normal range when corrected for hyperglycemia.  Continue to monitor BMP.  Chest pain ACS less likely troponin negative and EKG without acute ischemic changes.  Continue cardiac monitoring and trend troponin.  PE less likely given no hypoxia.  Abnormal urinalysis UA with negative nitrite, small amount of leukocytes, and microscopy showing 11-20 RBCs, 6-10 WBCs, and rare bacteria.  Patient is endorsing some burning at the tip of his penis and noted to have  signs of Candida balanitis.  Not endorsing any urinary symptoms otherwise.  No fever or leukocytosis.  Urine culture added on.  Headache Possibly from dehydration.  CT head negative for acute finding and no new focal neurodeficit.  Appears comfortable at this time, continue symptomatic management.  Candida balanitis Clotrimazole cream and hygiene.  Continue to monitor.  Hypertension Hyperlipidemia History of stroke Seizure disorder Pharmacy med rec pending.  DVT prophylaxis: Continue Eliquis after pharmacy med rec is done. Code Status: Full Code (discussed with the patient) Family Communication: No family available at this time. Level of care: Progressive Care Unit Admission status: It is my clinical opinion that referral for OBSERVATION is reasonable and necessary in this patient based on the above information provided. The aforementioned taken together are felt to place the patient at high risk for further clinical deterioration. However, it is anticipated that the patient may be medically stable for discharge from the hospital within 24 to 48 hours.   Shela Leff MD [facility]  If 7PM-7AM, please contact night-coverage [email or web address]  05/24/2022, 1:52 AM

## 2022-05-24 NOTE — Assessment & Plan Note (Signed)
Continue with statin therapy, blood pressure control and anticoagulation for atrial fibrillation.   Seizures, controlled. Continue with keppra and topiramate.

## 2022-05-24 NOTE — Progress Notes (Signed)
Called regarding critical labs, serum K+ 2.8 while on insulin drip.  Insulin drip paused to replete serum potassium, both orally and intravenously.  BMP ordered to be repeated 6:30 AM.  Will resume insulin drip at that time.  No charge note.

## 2022-05-24 NOTE — Progress Notes (Signed)
PT Cancellation Note  Patient Details Name: Eddie Conway MRN: [id number] DOB: 1960/12/31   Cancelled Treatment:    Reason Eval/Treat Not Completed: Medical issues which prohibited therapy (Per RN and OT, pt tachycardic with HR 130-140bpm at rest. Will follow up for PT evaluation as appropriate)   Viann Shove 05/24/2022, 3:04 PM

## 2022-05-24 NOTE — Hospital Course (Signed)
Mr. Eddie Conway was admitted to the hospital with the working diagnosis of diabetes ketoacidosis.   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 He reported blurry vision, polyuria, polydipsia, headache, abdominal pain and dyspnea. At home is glucometer has been reading high for several weeks. At home he run out of insulin. He was evaluated by his primary care the day prior to hospitalization, his had blood work obtained, because abnormal results he was called and advised to come to the ED. On his initial physical examination his blood pressure was 147/108, HR 77, RR 16 and 02 saturation 97%, oral mucosa was dry, heart with S1 and S2 present and irregular, lungs with no wheezing or rales, abdomen with no distention and no lower extremity edema.   Na 125, K 3.6 CL 90, bicarbonate 17, glucose 799, bun 24, cr 1,59  High sensitive troponin 15  Wbc 6,7 hgb 14.6 plt 202  Urine analysis SG 1,021, negative protein, 6 to 10 wbc, > 500 glucose.   Head CT with no acute changes. Remote right MCA infarct.   Chest radiograph with no effusions or infiltrates. No cardiomegaly.   EKG 130 bpm, normal axis, normal qtc, atrial flutter with variable block, no significant ST segment changes, negative T wave V5 and V6.   Patient was placed on insulin drip for glucose control.  K supplementation for hypokalemia.

## 2022-05-24 NOTE — Assessment & Plan Note (Signed)
Hypomagnesemia. Hyponatremia.   Renal function with serum cr at 1,0 with K at 3,1 and serum bicarbonate at 22.  Na 134 and Mg at 1,8   Plan to continue Kcl correction with KCL 40 meq x3 and add 2 g mag sulfate. Follow up BMP this pm.  Hold on IV fluids.

## 2022-05-24 NOTE — Progress Notes (Signed)
   05/24/22 1530  Spiritual Encounters  Type of Visit Initial  Care provided to: Patient  Referral source Patient request  Reason for visit Advance directives  OnCall Visit No  Spiritual Framework  Presenting Themes Meaning/purpose/sources of inspiration;Values and beliefs;Rituals and practive  Community/Connection Family;Faith community  Patient Stress Factors None identified  Family Stress Factors None identified  Goals  Self/Personal Goals healing  Interventions  Spiritual Care Interventions Made Established relationship of care and support;Prayer;Narrative/life review  Advance Directives (For Healthcare)  Would patient like information on creating a medical advance directive? Yes (Inpatient - patient defers creating a medical advance directive at this time - Information given)   Visited patient in response to request. Patient asked for advance directive education and documentation. Patient wants to discuss with spouse and mother. Left forms with patient. Patient also requested prayer.

## 2022-05-24 NOTE — Progress Notes (Signed)
OT Cancellation Note  Patient Details Name: Eddie Conway MRN: [id number] DOB: August 06, 1960   Cancelled Treatment:    Reason Eval/Treat Not Completed: Medical issues which prohibited therapy (pt tachycardic, HR 130-140bpm at rest. Will follow up for OT evaluation as appropriate)  Renaye Rakers, OTD, OTR/L SecureChat [facility] [phone]   [name] 05/24/2022, 1:42 PM

## 2022-05-24 NOTE — Progress Notes (Signed)
[facility] night cross cover note:   I was notified by RN that this patient converted to NSR at 2028.   Also, patient's nightly CBG noted to be 306.  He is due for 20 units of basal insulin this evening, but has no qhs sliding scale insulin coverage.  I subsequently added nightly sliding scale insulin coverage.     Babs Bertin, DO Hospitalist

## 2022-05-25 ENCOUNTER — Other Ambulatory Visit (HOSPITAL_COMMUNITY): Payer: Self-pay

## 2022-05-25 DIAGNOSIS — I10 Essential (primary) hypertension: Secondary | ICD-10-CM

## 2022-05-25 DIAGNOSIS — E1110 Type 2 diabetes mellitus with ketoacidosis without coma: Secondary | ICD-10-CM | POA: Diagnosis not present

## 2022-05-25 DIAGNOSIS — E876 Hypokalemia: Secondary | ICD-10-CM | POA: Diagnosis not present

## 2022-05-25 LAB — URINE CULTURE: Culture: 40000 — AB

## 2022-05-25 LAB — BASIC METABOLIC PANEL
Anion gap: 8 (ref 5–15)
BUN: 10 mg/dL (ref 8–23)
CO2: 25 mmol/L (ref 22–32)
Calcium: 8.7 mg/dL — ABNORMAL LOW (ref 8.9–10.3)
Chloride: 105 mmol/L (ref 98–111)
Creatinine, Ser: 1.09 mg/dL (ref 0.61–1.24)
GFR, Estimated: >60 mL/min (ref >60)
Glucose, Bld: 121 mg/dL — ABNORMAL HIGH (ref 70–99)
Potassium: 3.6 mmol/L (ref 3.5–5.1)
Sodium: 138 mmol/L (ref 135–145)

## 2022-05-25 LAB — GLUCOSE, CAPILLARY
Glucose-Capillary: 133 mg/dL — ABNORMAL HIGH (ref 70–99)
Glucose-Capillary: 271 mg/dL — ABNORMAL HIGH (ref 70–99)
Glucose-Capillary: 328 mg/dL — ABNORMAL HIGH (ref 70–99)

## 2022-05-25 LAB — HEMOGLOBIN A1C
Hgb A1c MFr Bld: 15.1 % — ABNORMAL HIGH (ref 4.8–5.6)
Mean Plasma Glucose: 387 mg/dL

## 2022-05-25 LAB — MAGNESIUM: Magnesium: 1.8 mg/dL (ref 1.7–2.4)

## 2022-05-25 MED ORDER — CLOTRIMAZOLE 1 % EX CREA
TOPICAL_CREAM | Freq: Two times a day (BID) | CUTANEOUS | 0 refills | Status: AC
  Filled 2022-05-25: qty 30, 7d supply, fill #0

## 2022-05-25 MED ORDER — INSULIN PEN NEEDLE 32G X 4 MM MISC
0 refills | Status: AC
  Filled 2022-05-25: qty 100, fill #0

## 2022-05-25 MED ORDER — DILTIAZEM HCL ER COATED BEADS 180 MG PO CP24: 180.0000 mg | ORAL_CAPSULE | Freq: Every day | ORAL | 0 refills | Status: AC

## 2022-05-25 MED ORDER — INSULIN PEN NEEDLE 32G X 4 MM MISC
0 refills | Status: DC
  Filled 2022-05-25: qty 100, 50d supply, fill #0

## 2022-05-25 MED ORDER — LANTUS SOLOSTAR 100 UNIT/ML ~~LOC~~ SOPN
15.0000 [IU] | PEN_INJECTOR | Freq: Two times a day (BID) | SUBCUTANEOUS | 0 refills | Status: AC
  Filled 2022-05-25: qty 9, 30d supply, fill #0

## 2022-05-25 MED ORDER — APIXABAN 5 MG PO TABS
5.0000 mg | ORAL_TABLET | Freq: Two times a day (BID) | ORAL | 0 refills | Status: AC
  Filled 2022-05-25: qty 60, 30d supply, fill #0

## 2022-05-25 MED ORDER — INSULIN ASPART 100 UNIT/ML FLEXPEN: 6.0000 [IU] | PEN_INJECTOR | Freq: Three times a day (TID) | SUBCUTANEOUS | 0 refills | Status: AC

## 2022-05-25 NOTE — Discharge Summary (Addendum)
Physician Discharge Summary   Patient: Eddie Conway MRN: [id number] DOB: Jul 26, 1960  Admit date:     05/23/2022  Discharge date: 05/25/22  Discharge Physician: Eddie Conway   PCP: [facility], Eddie Conway   Recommendations at discharge:    Patient will continue insulin therapy, dose has been increased of long acting insulin to 15 units bid.  Continue pre meal short acting insulin pre meals, and close glucose monitoring at home.  Anticoagulation with apixaban for atrial fibrillation.  Topical diflucan for genital candida.   Discharge Diagnoses: Principal Problem:   DKA (diabetic ketoacidosis) ([place]) Active Problems:   Hypokalemia   Essential hypertension   Paroxysmal atrial fibrillation with rapid ventricular response (HCC)   History of right MCA stroke   Mixed hyperlipidemia   Candidal balanitis  Resolved Problems:   * No resolved hospital problems. [facility] Course: [name] was admitted to the hospital with the working diagnosis of diabetes ketoacidosis.   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 He reported blurry vision, polyuria, polydipsia, headache, abdominal pain and dyspnea. At home is glucometer has been reading high for several weeks. At home he run out of insulin. He was evaluated by his primary care the day prior to hospitalization, he had blood work obtained. Results were abnormal, he was called and advised to come to the ED. On his initial physical examination his blood pressure was 147/108, HR 77, RR 16 and 02 saturation 97%, oral mucosa was dry, heart with S1 and S2 present and irregular, lungs with no wheezing or rales, abdomen with no distention and no lower extremity edema.   Na 125, K 3.6 CL 90, bicarbonate 17, glucose 799, bun 24, cr 1,59  High sensitive troponin 15  Wbc 6,7 hgb  14.6 plt 202  Urine analysis SG 1,021, negative protein, 6 to 10 wbc, > 500 glucose.   Head CT with no acute changes. Remote right MCA infarct.   Chest radiograph with no effusions or infiltrates. No cardiomegaly.   EKG 130 bpm, normal axis, normal qtc, atrial flutter with variable block, no significant ST segment changes, negative T wave V5 and V6.   Patient was placed on insulin drip for glucose control.  K supplementation for hypokalemia.   03/21 DKA has resolved, patient converted to sinus rhythm.  Plan to follow up as outpatient, refill for insulin and diltiazem along with apixaban.   Assessment and Plan: * DKA (diabetic ketoacidosis) ([place]) T2DM with uncontrolled hyperglycemia.   Patient was treated with insulin drip, glucose improved and anion gap closes.  Patient was transitioned to sq insulin with good toleration.   His fasting glucose today is 121 mg/dl.  Patient will continue basal insulin 15 units bid along with pre meal short acting insulin 6 units.  Continue close follow up on capillary glucose monitoring at home.   Hypokalemia Hypomagnesemia. Hyponatremia.   Patient had K and Mg corrected. At the time of his discharge his serum cr is 1,0 with K at 3,6 and Mg at 1,8.   Plan to follow up renal function as outpatient.   Essential hypertension Blood pressure has been controlled, plan to continue with oral diltiazem.   Paroxysmal atrial fibrillation with rapid ventricular response (HCC) Patient developed atrial fibrillation with RVR, he required IV diltiazem for rate control.  He was placed on oral diltiazem and successfully weaned  off IV diltiazem.  Plan to continue anticoagulation with apixaban.   History of right MCA stroke Continue with statin therapy, blood pressure control and anticoagulation for atrial fibrillation.   Seizures, controlled. Continue with keppra and topiramate.   Mixed hyperlipidemia Continue statin therapy.   Candidal  balanitis Continue with topical clotrimazol.          Consultants: none  Procedures performed: none   Disposition: Home Diet recommendation:  Discharge Diet Orders (From admission, onward)     Start     Ordered   05/25/22 0000  Diet - low sodium heart healthy        05/25/22 1443           Cardiac and Carb modified diet DISCHARGE MEDICATION: Allergies as of 05/25/2022   No Known Allergies      Medication List     TAKE these medications    acetaminophen 500 MG tablet Commonly known as: TYLENOL Take 1,000 mg by mouth every 6 (six) hours as needed for moderate pain.   atorvastatin 40 MG tablet Commonly known as: LIPITOR Take 40 mg by mouth daily.   blood glucose meter kit and supplies Kit Dispense based on patient and insurance preference. Use up to four times daily as directed.   cholecalciferol 25 MCG (1000 UNIT) tablet Commonly known as: VITAMIN D3 Take 1,000 Units by mouth daily.   clotrimazole 1 % cream Commonly known as: LOTRIMIN Apply topically 2 (two) times daily for 7 days.   cyanocobalamin 500 MCG tablet Commonly known as: VITAMIN B12 Take 500 mcg by mouth daily.   diltiazem 180 MG 24 hr capsule Commonly known as: CARDIZEM CD Take 1 capsule (180 mg total) by mouth daily.   Eliquis 5 MG Tabs tablet Generic drug: apixaban Take 1 tablet (5 mg total) by mouth 2 (two) times daily.   insulin aspart 100 UNIT/ML FlexPen Commonly known as: NOVOLOG Inject 6 Units into the skin 3 (three) times daily with meals. What changed: when to take this   Insulin Pen Needle 32G X 4 MM Misc Use as directed with Lantus   Lantus SoloStar 100 UNIT/ML Solostar Pen Generic drug: insulin glargine Inject 15 Units into the skin 2 (two) times daily. What changed:  how much to take when to take this   levETIRAcetam 750 MG tablet Commonly known as: KEPPRA Take 750 mg by mouth 2 (two) times daily.   topiramate 100 MG tablet Commonly known as: TOPAMAX Take  100 mg by mouth in the morning, at noon, and at bedtime.        Discharge Exam: Filed Weights   05/23/22 2002 05/24/22 0300 05/25/22 0507  Weight: 93.4 kg 91.4 kg 92.1 kg   BP (!) 154/98   Pulse 70   Temp 98.4 F (36.9 C) (Oral)   Resp 11   Ht 6' (1.829 m)   Wt 92.1 kg   SpO2 98%   BMI 27.54 kg/m   Patient is feeling better, no chest pain, no dyspnea, no abdominal pain, no nausea or vomiting  Neurology awake and alert ENT with no pallor Cardiovascular with S1 and S2 present and rhythmic with no gallops, rubs or murmurs Respiratory with no rales or wheezing Abdomen with no distention  No lower extremity edema   Condition at discharge: stable  The results of significant diagnostics from this hospitalization (including imaging, microbiology, ancillary and laboratory) are listed below for reference.   Imaging Studies: DG Chest 2 View  Result Date: 05/23/2022 CLINICAL DATA:  Shortness of breath. EXAM: CHEST - 2 VIEW COMPARISON:  Radiograph 04/04/2021, CT 02/06/2020 FINDINGS: Heart size upper normal. Stable mediastinal contours. The lungs are clear. Pulmonary vasculature is normal. No consolidation, pleural effusion, or pneumothorax. No acute osseous abnormalities are seen. IMPRESSION: No acute abnormality. Electronically Signed   By: Keith Rake M.D.   On: 05/23/2022 20:39   CT Head Wo Contrast  Result Date: 05/23/2022 CLINICAL DATA:  Vision changes.  History of stroke. EXAM: CT HEAD WITHOUT CONTRAST TECHNIQUE: Contiguous axial images were obtained from the base of the skull through the vertex without intravenous contrast. RADIATION DOSE REDUCTION: This exam was performed according to the departmental dose-optimization program which includes automated exposure control, adjustment of the mA and/or kV according to patient size and/or use of iterative reconstruction technique. COMPARISON:  Brain MRI 04/07/2021 FINDINGS: Brain: Dense encephalomalacia from remote right MCA  territory infarct. Encephalomalacia with calcification along a abandoned left frontal ventriculostomy tract. No evidence of acute infarct, hemorrhage, hydrocephalus, or mass. Vascular: No hyperdense vessel or unexpected calcification. Skull: Unremarkable remote right craniotomy. Sinuses/Orbits: No acute finding. IMPRESSION: No acute or interval finding. Remote right MCA territory infarct. Electronically Signed   By: Jorje Guild M.D.   On: 05/23/2022 20:24    Microbiology: Results for orders placed or performed during the hospital encounter of 05/23/22  Urine Culture (for pregnant, neutropenic or urologic patients or patients with an indwelling urinary catheter)     Status: Abnormal   Collection Time: 05/24/22  3:55 AM   Specimen: Urine, Clean Catch  Result Value Ref Range Status   Specimen Description URINE, CLEAN CATCH  Final   Special Requests NONE  Final   Culture (A)  Final    40,000 COLONIES/mL YEAST 50,000 COLONIES/mL LACTOBACILLUS SPECIES Standardized susceptibility testing for this organism is not available. Performed at [facility], [employer] [address], [place], [place] [address]    Report Status 05/25/2022 FINAL  Final    Labs: CBC: Recent Labs  Lab 05/23/22 1941 05/23/22 2111 05/23/22 2122  WBC 6.7  --   --   NEUTROABS 4.7  --   --   HGB 14.6 16.7 16.7  HCT 44.6 49.0 49.0  MCV 88.0  --   --   PLT 202  --   --    Basic Metabolic Panel: Recent Labs  Lab 05/24/22 0021 05/24/22 0337 05/24/22 0559 05/24/22 1612 05/25/22 0618  NA 129* 137 134* 133* 138  K 2.7* 2.8* 3.1* 3.8 3.6  CL 100 97* 100 101 105  CO2 16* 20* 22 21* 25  GLUCOSE 402* 144* 180* 307* 121*  BUN 16 14 15 10 10   CREATININE 1.15 1.11 1.08 1.26* 1.09  CALCIUM 7.2* 8.9 8.5* 8.7* 8.7*  MG  --   --  1.8  --  1.8   Liver Function Tests: No results for input(s): "AST", "ALT", "ALKPHOS", "BILITOT", "PROT", "ALBUMIN" in the last 168 hours. CBG: Recent Labs  Lab 05/24/22 1119 05/24/22 1619  05/24/22 2049 05/25/22 0607 05/25/22 1211  GLUCAP 163* 370* 306* 133* 271*    Discharge time spent: greater than 30 minutes.  Signed: Tawni Millers, MD [facility] 05/25/2022

## 2022-05-25 NOTE — Progress Notes (Signed)
Dr. Velia Meyer was made aware that pt converted for afib to NSR at 2028 hrs last night. Dr. Velia Meyer was made aware that pt's glucose was 306, and pt's doesn't have sliding scale coverage at night, but he has semglee 20 units. New orders for slide scale insulin were implemented; pt received novolog 4unit sq

## 2022-05-25 NOTE — TOC Progression Note (Signed)
Transition of Care [facility]) - Progression Note    Patient Details  Name: Eddie Conway MRN: [id number] Date of Birth: 08-07-1960  Transition of Care [facility]) CM/SW Contact  Zenon Mayo, RN Phone Number: 05/25/2022, 10:04 AM  Clinical Narrative:     Transition of Care [facility]) Screening Note   Patient Details  Name: Eddie Conway Date of Birth: 1960-08-04   Transition of Care [facility]) CM/SW Contact:    Zenon Mayo, RN Phone Number: 05/25/2022, 10:04 AM    Transition of Care Department [facility]) has reviewed patient and no TOC needs have been identified at this time. We will continue to monitor patient advancement through interdisciplinary progression rounds. If new patient transition needs arise, please place a TOC consult.  from home with wife, DKA.          Expected Discharge Plan and Services                                               Social Determinants of Health (SDOH) Interventions SDOH Screenings   Food Insecurity: Food Insecurity Present (05/24/2022)  Housing: Medium Risk (05/24/2022)  Transportation Needs: Unmet Transportation Needs (05/24/2022)  Utilities: At Risk (05/24/2022)  Tobacco Use: Low Risk  (05/24/2022)    Readmission Risk Interventions     No data to display

## 2022-05-25 NOTE — Inpatient Diabetes Management (Signed)
Inpatient Diabetes Program Recommendations  AACE/ADA: New Consensus Statement on Inpatient Glycemic Control (2015)  Target Ranges:  Prepandial:   less than 140 mg/dL      Peak postprandial:   less than 180 mg/dL (1-2 hours)      Critically ill patients:  140 - 180 mg/dL   Lab Results  Component Value Date   GLUCAP 271 (H) 05/25/2022   HGBA1C 15.1 (H) 05/24/2022    Diabetes history: DM2 Outpatient Diabetes medications: Lantus 20 units qd, Novolog 6 units ac lunch Current orders for Inpatient glycemic control: Semglee 20 units qd, Novolog 3 units tid meal coverage, Novolog 0-15 units tid correction  Spoke with patient regarding the difference in long acting and short acting insulin. Discussed short acting insulin to be given prior to each meal. Patient states understanding of when to take the different insulins. Patient has Novolog insulin @ home but ran out of Lantus. Also discussed hypoglycemia and pt verbalized treatment regimen.  Please consider: -Increase Novolog meal coverage to 5 units tid if eats 50% meals.  Thank you, Nani Gasser. Neleh Muldoon, RN, MSN, CDE  Diabetes Coordinator Inpatient Glycemic Control Team Team Pager [phone] (8am-5pm) 05/25/2022 2:37 PM

## 2022-05-25 NOTE — Care Management Obs Status (Signed)
[place] NOTIFICATION   Patient Details  Name: Eddie Conway MRN: [id number] Date of Birth: Jul 16, 1960   Medicare Observation Status Notification Given:  Yes    Zenon Mayo, RN 05/25/2022, 2:53 PM

## 2022-05-25 NOTE — Evaluation (Signed)
Occupational Therapy Evaluation and Discharge Summary  Patient Details Name: Eddie Conway MRN: [id number] DOB: August 13, 1960 Today's Date: 05/25/2022   History of Present Illness 63 year old male admitted on 3/20 with complaints of blurry vision, polyuria/dysuria, polydipsia, abdominal pain, headache, and shortness of breath. 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   Clinical Impression   Pt admitted for above diagnosis. PTA, patient lives with spouse that is available for 24/7 support, patient was Mod I in bADLs and IADLs using quad cane and compensatory hemi techniques following residual CVA deficits. Patient needing Min guard assist and increased time to complete sit>stand from bed level, ambulated in the room and completed dressing at Supervision level while sitting. Vital signs remained stable during functional activity. Patient functioning at or near baseline at this time, Pt has no further needs for skilled acute OT services at this time, reconsult OT if patient functinal status changes. No follow-up OT services recommended at this time.     Recommendations for follow up therapy are one component of a multi-disciplinary discharge planning process, led by the attending physician.  Recommendations may be updated based on patient status, additional functional criteria and insurance authorization.   Follow Up Recommendations  No OT follow up     Assistance Recommended at Discharge PRN  Patient can return home with the following A little help with walking and/or transfers;Assist for transportation;Assistance with cooking/housework;A little help with bathing/dressing/bathroom    Functional Status Assessment  Patient has not had a recent decline in their functional status  Equipment Recommendations  None recommended by OT     Recommendations for Other Services       Precautions / Restrictions Precautions Precautions: Fall Restrictions Weight Bearing Restrictions: No      Mobility Bed Mobility Overal bed mobility: Modified Independent             General bed mobility comments: HOB elevated    Transfers Overall transfer level: Needs assistance Equipment used: Quad cane Transfers: Sit to/from Stand Sit to Stand: Min guard           General transfer comment: Inital difficulty performing sit>stand from bed.      Balance Overall balance assessment: History of Falls, No apparent balance deficits (not formally assessed)                                         ADL either performed or assessed with clinical judgement   ADL Overall ADL's : Needs assistance/impaired Eating/Feeding: Supervision/ safety;Set up   Grooming: Standing;Set up;Supervision/safety Grooming Details (indicate cue type and reason): Pt brushed teeth standing at sink, Supervision/setup Upper Body Bathing: Modified independent;Sitting   Lower Body Bathing: Modified independent;Sitting/lateral leans   Upper Body Dressing : Sitting;Modified independent Upper Body Dressing Details (indicate cue type and reason): Pt donned gown using hemi dressing techniqes Lower Body Dressing: Modified independent;Sitting/lateral leans   Toilet Transfer: Modified Independent (using quad cane)   Toileting- Clothing Manipulation and Hygiene: Modified independent;Sit to/from stand   Tub/ Banker: Modified independent (quad cane)   Functional mobility during ADLs: Supervision/safety;Cane (quad cane) General ADL Comments: Patient demos abilitiy to don/doff upper body clothing and complete oral hygiene standing at sink using hemi strategies. Pt performs techniques at baseline     Vision  Perception     Praxis      Pertinent Vitals/Pain Pain Assessment Pain Assessment: No/denies pain     Hand  Dominance Right   Extremity/Trunk Assessment Upper Extremity Assessment Upper Extremity Assessment: LUE deficits/detail LUE Deficits / Details: Flaccid LUE from previous CVA.   Lower Extremity Assessment Lower Extremity Assessment: LLE deficits/detail LLE Deficits / Details: Decreased ROM and strength from previous CVA   Cervical / Trunk Assessment Cervical / Trunk Assessment: Normal   Communication Communication Communication: No difficulties   Cognition Arousal/Alertness: Awake/alert Behavior During Therapy: WFL for tasks assessed/performed Overall Cognitive Status: Within Functional Limits for tasks assessed                                       General Comments  VSS on RA, LLE able to partially move against gravity. Pt reports LLE is moving better since admitting incidence    Exercises     Shoulder Instructions      Home Living Family/patient expects to be discharged to:: Private residence Living Arrangements: Spouse/significant other Available Help at Discharge: Available 24 hours/day;Family Type of Home: Apartment Home Access: Stairs to enter Entrance Stairs-Number of Steps: 13 Entrance Stairs-Rails: Right Home Layout: One level (2nd floor apartment)     Bathroom Shower/Tub: Teacher, early years/pre: Handicapped height     Home Equipment: [place] - quad;Shower seat   Additional Comments: Toilet has handles to push off of      Prior Functioning/Environment Prior Level of Function : Independent/Modified Independent;History of Falls (last six months) (Per PT, 1 mechanical fall last month. pt reports tripping over bed sheet on floor. No injury.)             Mobility Comments: Mod I with quad cane ADLs Comments: Independent/Mod I        OT Problem List: Cardiopulmonary status limiting activity      OT Treatment/Interventions:      OT Goals(Current goals can be found in the care plan section) Acute Rehab OT Goals Patient  Stated Goal: Return home OT Goal Formulation: With patient Time For Goal Achievement: 06/08/22 Potential to Achieve Goals: Good  OT Frequency:      Co-evaluation              AM-PAC OT "6 Clicks" Daily Activity     Outcome Measure Help from another person eating meals?: None Help from another person taking care of personal grooming?: None Help from another person toileting, which includes using toliet, bedpan, or urinal?: None Help from another person bathing (including washing, rinsing, drying)?: A Little Help from another person to put on and taking off regular upper body clothing?: None Help from another person to put on and taking off regular lower body clothing?: A Little 6 Click Score: 22   End of Session Equipment Utilized During Treatment: Gait belt;Other (comment) (quad cane) Nurse Communication: Mobility status  Activity Tolerance: Patient tolerated treatment well Patient left: in bed;with call bell/phone within reach;with bed alarm set  OT Visit Diagnosis: Other (comment);Pain (Elevated blood sugar) Pain - part of body:  (abdominal)                Time: [id number] OT Time Calculation (min): 26 min Charges:  OT General Charges $OT Visit: 1 Visit OT Evaluation $OT Eval Moderate Complexity: 1 Mod OT Treatments $Therapeutic Activity: 8-22 mins  05/25/2022  AB, OTR/L  Acute Rehabilitation  Services  Office: [place] 05/25/2022, 11:45 AM

## 2022-05-25 NOTE — Progress Notes (Signed)
   05/25/22 1142  Spiritual Encounters  Type of Visit Follow up  Care provided to: Patient  Conversation partners present during encounter Nurse  Reason for visit Routine spiritual support  OnCall Visit No  Spiritual Framework  Presenting Themes Meaning/purpose/sources of inspiration;Impactful experiences and emotions;Rituals and practive  Values/beliefs [place]  Patient Stress Factors None identified  Family Stress Factors None identified  Goals  Self/Personal Goals prayer  Interventions  Spiritual Care Interventions Made Reflective listening;Prayer;Explored values/beliefs/practices/strengths  Intervention Outcomes  Outcomes Connection to spiritual care;Awareness of support   Follow up visit to patient from yesterday. Patient's wife took the advance directive home to review. During visit patient and chaplain engaged in reflection and life changes. Patient and chaplain also did some religious reflection and scriptural recognition/application. Patient enjoys sharing his faith.  Patient requested prayer with chaplain and then patient prayed for chaplain.  Patients will contact chaplains office if return visit is necessary and/or advance directive is returned.

## 2022-05-25 NOTE — TOC Transition Note (Signed)
Transition of Care [facility]) - CM/SW Discharge Note   Patient Details  Name: Eddie Conway MRN: [id number] Date of Birth: 10/24/1960  [facility]) CM/SW Contact:  Zenon Mayo, RN Phone Number: 05/25/2022, 2:56 PM   Clinical Narrative:    Patient is for dc today, he has no needs. Wife will be transporting him home today.         Patient Goals and CMS Choice      Discharge Placement                         Discharge Plan and Services Additional resources added to the After Visit Summary for                                       Social Determinants of Health (SDOH) Interventions SDOH Screenings   Food Insecurity: Food Insecurity Present (05/24/2022)  Housing: Medium Risk (05/24/2022)  Transportation Needs: Unmet Transportation Needs (05/24/2022)  Utilities: At Risk (05/24/2022)  Tobacco Use: Low Risk  (05/24/2022)     Readmission Risk Interventions     No data to display

## 2022-05-25 NOTE — Evaluation (Signed)
Physical Therapy Evaluation Patient Details Name: Eddie Conway MRN: [id number] DOB: September 22, 1960 Today's Date: 05/25/2022  History of Present Illness  62 year old male admitted on 3/20 with complaints of blurry vision, polyuria/dysuria, polydipsia, abdominal pain, headache, and shortness of breath. 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  Clinical Impression  Pt presents with admitting diagnosis above. Pt tolerated treatment well today. Pt was able to ambulate with quad cane short distance and navigate flight of stairs at supervision to min guard level. Pt presents at or near baseline mobility. Pt has no further acute PT needs and will be signing off. Pt has no post acute PT needs. Re consult PT if mobility status changes.        Recommendations for follow up therapy are one component of a multi-disciplinary discharge planning process, led by the attending physician.  Recommendations may be updated based on patient status, additional functional criteria and insurance authorization.  Follow Up Recommendations No PT follow up      Assistance Recommended at Discharge PRN  Patient can return home with the following  Assist for transportation;A little help with walking and/or transfers;A little help with bathing/dressing/bathroom;Help with stairs or ramp for entrance    Equipment Recommendations None recommended by PT (Has needed DME)  Recommendations for Other Services       Functional Status Assessment Patient has had a recent decline in their functional status and demonstrates the ability to make significant improvements in function in a reasonable and predictable amount of time.     Precautions / Restrictions Precautions Precautions: Fall Restrictions Weight Bearing Restrictions: No      Mobility  Bed  Mobility Overal bed mobility: Modified Independent             General bed mobility comments: HOB elevated    Transfers Overall transfer level: Needs assistance Equipment used: Quad cane Transfers: Sit to/from Stand Sit to Stand: Supervision, Min guard           General transfer comment: Min guard once fatigued    Ambulation/Gait Ambulation/Gait assistance: Supervision Gait Distance (Feet): 15 Feet Assistive device: Quad cane Gait Pattern/deviations: Decreased step length - left, Decreased stance time - left, Knee hyperextension - left, Step-to pattern (Hemiparetic gait) Gait velocity: decreased     General Gait Details: Hemiparetic gait. No LOB noted.  Stairs Stairs: Yes Stairs assistance: Supervision, Min guard Stair Management: One rail Right, Step to pattern, Forwards, With cane Number of Stairs: 15 General stair comments: No LOB noted. Increased time.  Wheelchair Mobility    Modified Rankin (Stroke Patients Only)       Balance Overall balance assessment: History of Falls, No apparent balance deficits (not formally assessed) (Overall very steady)                                           Pertinent Vitals/Pain Pain Assessment Pain Assessment: No/denies pain    Home Living Family/patient expects to be discharged to:: Private residence Living Arrangements: Spouse/significant other Available Help at Discharge: Available 24 hours/day;Family Type of Home: Apartment Home Access: Stairs to enter Entrance Stairs-Rails: Right Entrance Stairs-Number of Steps: 13   Home Layout: One level (2nd floor apartment) Home Equipment: Cane - quad;Shower seat      Prior Function Prior Level of  Function : Independent/Modified Independent;History of Falls (last six months) (1 mechanical fall last month. Pt reports tripping over bed sheet on floor. No injury.)             Mobility Comments: Mod I with quad cane ADLs Comments: Ind     Hand  Dominance   Dominant Hand: Right    Extremity/Trunk Assessment   Upper Extremity Assessment Upper Extremity Assessment: LUE deficits/detail LUE Deficits / Details: Flaccid LUE from previous CVA.    Lower Extremity Assessment Lower Extremity Assessment: LLE deficits/detail LLE Deficits / Details: Decreased ROM and strength from previous CVA       Communication   Communication: No difficulties  Cognition Arousal/Alertness: Awake/alert Behavior During Therapy: WFL for tasks assessed/performed Overall Cognitive Status: Within Functional Limits for tasks assessed                                          General Comments General comments (skin integrity, edema, etc.): VSS on RA    Exercises     Assessment/Plan    PT Assessment Patient does not need any further PT services  PT Problem List         PT Treatment Interventions      PT Goals (Current goals can be found in the Care Plan section)       Frequency       Co-evaluation               AM-PAC PT "6 Clicks" Mobility  Outcome Measure Help needed turning from your back to your side while in a flat bed without using bedrails?: None Help needed moving from lying on your back to sitting on the side of a flat bed without using bedrails?: None Help needed moving to and from a bed to a chair (including a wheelchair)?: None Help needed standing up from a chair using your arms (e.g., wheelchair or bedside chair)?: A Little Help needed to walk in hospital room?: None Help needed climbing 3-5 steps with a railing? : A Little 6 Click Score: 22    End of Session Equipment Utilized During Treatment: Gait belt Activity Tolerance: Patient tolerated treatment well Patient left: in chair;with call bell/phone within reach;with chair alarm set Nurse Communication: Mobility status PT Visit Diagnosis: Other abnormalities of gait and mobility (R26.89)    Time: [id number] PT Time Calculation (min) (ACUTE  ONLY): 53 min   Charges:   PT Evaluation $PT Eval Moderate Complexity: 1 Mod PT Treatments $Gait Training: 23-37 mins $Therapeutic Activity: 23-37 mins        Shelby Mattocks, PT, DPT Acute Rehab Services [id number]   Viann Shove 05/25/2022, 10:34 AM

## 2022-05-25 NOTE — Progress Notes (Signed)
AVS reviewed with patient including medications, medication administration times and pharmacy locations reviewed using teach-back method. Patient verbalized understanding.  Patient had no additional questions.  PIVs removed and patient waiting for spouse to bring clothes.  Patient alert and oriented x 4, room air, VS stable.  Patient has TOC medications at bedside.

## 2022-05-30 ENCOUNTER — Emergency Department (HOSPITAL_COMMUNITY): Payer: No Typology Code available for payment source

## 2022-05-30 ENCOUNTER — Other Ambulatory Visit: Payer: Self-pay

## 2022-05-30 ENCOUNTER — Emergency Department (HOSPITAL_COMMUNITY)
Admission: EM | Admit: 2022-05-30 | Discharge: 2022-05-30 | Disposition: A | Discharge status: Home / Self Care | Payer: No Typology Code available for payment source | Attending: Emergency Medicine | Admitting: Emergency Medicine

## 2022-05-30 DIAGNOSIS — E876 Hypokalemia: Secondary | ICD-10-CM | POA: Insufficient documentation

## 2022-05-30 DIAGNOSIS — I4891 Unspecified atrial fibrillation: Secondary | ICD-10-CM | POA: Diagnosis not present

## 2022-05-30 DIAGNOSIS — E119 Type 2 diabetes mellitus without complications: Secondary | ICD-10-CM | POA: Insufficient documentation

## 2022-05-30 DIAGNOSIS — R29810 Facial weakness: Secondary | ICD-10-CM | POA: Insufficient documentation

## 2022-05-30 DIAGNOSIS — R42 Dizziness and giddiness: Secondary | ICD-10-CM | POA: Diagnosis not present

## 2022-05-30 DIAGNOSIS — E860 Dehydration: Secondary | ICD-10-CM | POA: Insufficient documentation

## 2022-05-30 DIAGNOSIS — Z7901 Long term (current) use of anticoagulants: Secondary | ICD-10-CM | POA: Insufficient documentation

## 2022-05-30 LAB — CBC WITH DIFFERENTIAL/PLATELET
Abs Immature Granulocytes: 0.01 10*3/uL (ref 0.00–0.07)
Basophils Absolute: 0 10*3/uL (ref 0.0–0.1)
Basophils Relative: 0 %
Eosinophils Absolute: 0.1 10*3/uL (ref 0.0–0.5)
Eosinophils Relative: 3 %
HCT: 47.6 % (ref 39.0–52.0)
Hemoglobin: 15.6 g/dL (ref 13.0–17.0)
Immature Granulocytes: 0 %
Lymphocytes Relative: 41 %
Lymphs Abs: 1.9 10*3/uL (ref 0.7–4.0)
MCH: 29.3 pg (ref 26.0–34.0)
MCHC: 32.8 g/dL (ref 30.0–36.0)
MCV: 89.3 fL (ref 80.0–100.0)
Monocytes Absolute: 0.4 10*3/uL (ref 0.1–1.0)
Monocytes Relative: 9 %
Neutro Abs: 2.2 10*3/uL (ref 1.7–7.7)
Neutrophils Relative %: 47 %
Platelets: 192 10*3/uL (ref 150–400)
RBC: 5.33 MIL/uL (ref 4.22–5.81)
RDW: 14 % (ref 11.5–15.5)
WBC: 4.7 10*3/uL (ref 4.0–10.5)
nRBC: 0 % (ref 0.0–0.2)

## 2022-05-30 LAB — COMPREHENSIVE METABOLIC PANEL
ALT: 16 U/L (ref 0–44)
AST: 22 U/L (ref 15–41)
Albumin: 3 g/dL — ABNORMAL LOW (ref 3.5–5.0)
Alkaline Phosphatase: 95 U/L (ref 38–126)
Anion gap: 9 (ref 5–15)
BUN: 11 mg/dL (ref 8–23)
CO2: 23 mmol/L (ref 22–32)
Calcium: 9 mg/dL (ref 8.9–10.3)
Chloride: 101 mmol/L (ref 98–111)
Creatinine, Ser: 1.37 mg/dL — ABNORMAL HIGH (ref 0.61–1.24)
GFR, Estimated: 58 mL/min — ABNORMAL LOW (ref >60)
Glucose, Bld: 236 mg/dL — ABNORMAL HIGH (ref 70–99)
Potassium: 3.1 mmol/L — ABNORMAL LOW (ref 3.5–5.1)
Sodium: 133 mmol/L — ABNORMAL LOW (ref 135–145)
Total Bilirubin: 0.8 mg/dL (ref 0.3–1.2)
Total Protein: 8.1 g/dL (ref 6.5–8.1)

## 2022-05-30 LAB — CBG MONITORING, ED: Glucose-Capillary: 268 mg/dL — ABNORMAL HIGH (ref 70–99)

## 2022-05-30 MED ORDER — LACTATED RINGERS IV BOLUS
1000.0000 mL | Freq: Once | INTRAVENOUS | Status: AC
  Administered 2022-05-30: 1000 mL via INTRAVENOUS

## 2022-05-30 MED ORDER — POTASSIUM CHLORIDE CRYS ER 20 MEQ PO TBCR
40.0000 meq | EXTENDED_RELEASE_TABLET | Freq: Once | ORAL | Status: AC
  Administered 2022-05-30: 40 meq via ORAL
  Filled 2022-05-30: qty 2

## 2022-05-30 NOTE — ED Provider Notes (Signed)
[facility] Provider Note   CSN: [id number] Arrival date & time: 05/30/22  0037     History  Chief Complaint  Patient presents with   Dizziness    Pt was BIBA with c/o dizziness. Pt was d/c recently for DKA. Took his lantus and novolog around 12.    Eddie Conway is a 62 y.o. male.  The history is provided by the patient, medical records and the EMS personnel.  Dizziness Eddie Conway is a 62 y.o. male who presents to the Emergency Department complaining of dizziness.  He presents to the emergency department by EMS for evaluation of dizziness.  He states that he was well at 7 PM when he had dinner.  Around 10 PM he was trying to get cleaned up and he had about 5 to 6 minutes of what he describes as dizziness/vertigo symptoms with the room spinning.  He checked his blood sugar and it was elevated and he took 15 units of Lantus as well as 15 units of short acting insulin.  Overall his symptoms have resolved.  He denies any headache, chest pain, abdominal pain, nausea, vomiting.  He has chronic incontinence and dysuria and this is at his baseline.  He was recently admitted to the hospital for DKA and hyperglycemia.  He has been compliant with his medications since hospital discharge.  He does also have a history of seizures and takes Keppra.  He had been out of his Keppra for about 1 month and just took a dose this evening after he received a new supply from the pharmacy.  He has a history of prior MCA infarct with persistent left-sided deficits, A-fib on anticoagulation, diabetes, seizure disorder.  He lives at home with his wife and ambulates with a cane at baseline.    Home Medications Prior to Admission medications   Medication Sig Start Date End Date Taking? Authorizing Provider  acetaminophen (TYLENOL) 500 MG tablet Take 1,000 mg by mouth every 6 (six) hours as needed for moderate pain.    [provider]  apixaban (ELIQUIS) 5 MG  TABS tablet Take 1 tablet (5 mg total) by mouth 2 (two) times daily. 05/25/22 06/24/22  Arrien, Jimmy Picket, MD  atorvastatin (LIPITOR) 40 MG tablet Take 40 mg by mouth daily.    [provider]  blood glucose meter kit and supplies KIT Dispense based on patient and insurance preference. Use up to four times daily as directed. 04/09/21   Alma Friendly, MD  cholecalciferol (VITAMIN D3) 25 MCG (1000 UNIT) tablet Take 1,000 Units by mouth daily.    [provider]  clotrimazole (LOTRIMIN) 1 % cream Apply topically 2 (two) times daily for 7 days. 05/25/22 06/01/22  Arrien, Jimmy Picket, MD  diltiazem (CARDIZEM CD) 180 MG 24 hr capsule Take 1 capsule (180 mg total) by mouth daily. 05/25/22 06/24/22  Arrien, Jimmy Picket, MD  insulin aspart (NOVOLOG) 100 UNIT/ML FlexPen Inject 6 Units into the skin 3 (three) times daily with meals. 05/25/22   Arrien, Jimmy Picket, MD  insulin glargine (LANTUS SOLOSTAR) 100 UNIT/ML Solostar Pen Inject 15 Units into the skin 2 (two) times daily. 05/25/22 06/24/22  Arrien, Jimmy Picket, MD  Insulin Pen Needle 32G X 4 MM MISC Use as directed with Lantus 05/25/22   Arrien, Jimmy Picket, MD  levETIRAcetam (KEPPRA) 750 MG tablet Take 750 mg by mouth 2 (two) times daily.    [provider]  topiramate (TOPAMAX) 100 MG tablet Take  100 mg by mouth in the morning, at noon, and at bedtime.    [provider]  vitamin B-12 (CYANOCOBALAMIN) 500 MCG tablet Take 500 mcg by mouth daily.    [provider]      Allergies    Patient has no known allergies.    Review of Systems   Review of Systems  Neurological:  Positive for dizziness.  All other systems reviewed and are negative.   Physical Exam Updated Vital Signs BP (!) 137/99 (BP Location: Right Arm)   Pulse 72   Temp 98.4 F (36.9 C) (Oral)   Resp 18   SpO2 99%  Physical Exam Vitals and nursing note reviewed.  Constitutional:      Appearance: He is  well-developed.  HENT:     Head: Normocephalic and atraumatic.  Cardiovascular:     Rate and Rhythm: Rhythm irregular.     Heart sounds: No murmur heard. Pulmonary:     Effort: Pulmonary effort is normal. No respiratory distress.     Breath sounds: Normal breath sounds.  Abdominal:     Palpations: Abdomen is soft.     Tenderness: There is no abdominal tenderness. There is no guarding or rebound.  Musculoskeletal:        General: No tenderness.  Skin:    General: Skin is warm and dry.  Neurological:     Mental Status: He is alert and oriented to person, place, and time.     Comments: There is left facial weakness, left upper extremity paralysis.  Left lower extremity is 3 out of 5.  Psychiatric:        Behavior: Behavior normal.     ED Results / Procedures / Treatments   Labs (all labs ordered are listed, but only abnormal results are displayed) Labs Reviewed  COMPREHENSIVE METABOLIC PANEL - Abnormal; Notable for the following components:      Result Value   Sodium 133 (*)    Potassium 3.1 (*)    Glucose, Bld 236 (*)    Creatinine, Ser 1.37 (*)    Albumin 3.0 (*)    GFR, Estimated 58 (*)    All other components within normal limits  CBG MONITORING, ED - Abnormal; Notable for the following components:   Glucose-Capillary 268 (*)    All other components within normal limits  CBC WITH DIFFERENTIAL/PLATELET  CBG MONITORING, ED    EKG EKG Interpretation  Date/Time:  Tuesday May 30 2022 01:06:30 EDT Ventricular Rate:  72 PR Interval:    QRS Duration: 73 QT Interval:  386 QTC Calculation: 423 R Axis:   10 Text Interpretation: Atrial fibrillation Abnormal T, consider ischemia, lateral leads Confirmed by Quintella Reichert [phone]) on 05/30/2022 1:51:00 AM  Radiology CT Head Wo Contrast  Result Date: 05/30/2022 CLINICAL DATA:  Vertigo EXAM: CT HEAD WITHOUT CONTRAST TECHNIQUE: Contiguous axial images were obtained from the base of the skull through the vertex without  intravenous contrast. RADIATION DOSE REDUCTION: This exam was performed according to the departmental dose-optimization program which includes automated exposure control, adjustment of the mA and/or kV according to patient size and/or use of iterative reconstruction technique. COMPARISON:  05/23/2022 FINDINGS: Brain: No mass, hemorrhage or extra-axial collection. There is advanced encephalomalacia throughout the right MCA territory. Vascular: No abnormal hyperdensity of the major intracranial arteries or dural venous sinuses. No intracranial atherosclerosis. Skull: Remote right craniotomy. Sinuses/Orbits: No fluid levels or advanced mucosal thickening of the visualized paranasal sinuses. No mastoid or middle ear effusion. The orbits are  normal. IMPRESSION: 1. No acute intracranial abnormality. 2. Advanced encephalomalacia throughout the right MCA territory. Electronically Signed   By: Ulyses Jarred M.D.   On: 05/30/2022 03:06    Procedures Procedures    Medications Ordered in ED Medications  lactated ringers bolus 1,000 mL (0 mLs Intravenous Stopped 05/30/22 0626)  potassium chloride SA (KLOR-CON M) CR tablet 40 mEq (40 mEq Oral Given 05/30/22 0400)    ED Course/ Medical Decision Making/ A&P                             Medical Decision Making Amount and/or Complexity of Data Reviewed Labs: ordered. Radiology: ordered.  Risk Prescription drug management.   Patient with history of A-fib on anticoagulation, prior CVA, poorly controlled diabetes here for evaluation of brief episode of dizziness and hyperglycemia.  His blood sugars have improved with his administration of his regular scheduled home insulin.  He has deficits on examination that are at his baseline.  Labs with mild hypokalemia, hyperglycemia and elevation in his creatinine but no evidence of DKA.  Labs are consistent with mild dehydration and he was treated with IV fluids.  He was also treated with potassium supplementation.  CT head  is negative for acute abnormality.  Patient initially unable to stand but he did not have his cane, which he requires for ambulation and standing.  When a cane was available patient was able to ambulate at his baseline.  Current clinical picture is not consistent with acute CVA or encephalopathy.  Feel he is stable to discharge home with outpatient follow-up and return precautions.        Final Clinical Impression(s) / ED Diagnoses Final diagnoses:  Dehydration  Dizziness  Hypokalemia    Rx / DC Orders ED Discharge Orders     None         Quintella Reichert, MD 05/30/22 [phone]

## 2022-05-30 NOTE — ED Triage Notes (Signed)
(  Pt was BIBA with c/o dizziness. Pt was d/c recently for DKA. Took his lantus and novolog around 12.)

## 2022-05-30 NOTE — ED Notes (Signed)
Patient was cleaned and linen change due to incontinence.

## 2022-06-15 ENCOUNTER — Other Ambulatory Visit (HOSPITAL_COMMUNITY): Payer: Self-pay

## 2022-07-15 IMAGING — CT CT RENAL STONE PROTOCOL
2 of 4 series · 15 of 46 positions shown, 17 images · non-contrast
Comparison: None.

CLINICAL DATA: Nephrolithiasis



[Series 3: ap without · axial · non-contrast · 0.78mm/px · z∈[+774,+1254]mm · 12 of 110 slices shown, 14 images]
[im 7/110  soft-tissue]
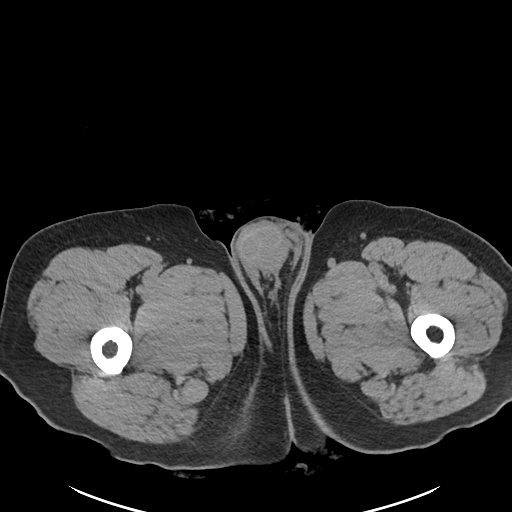
[im 7/110  bone]
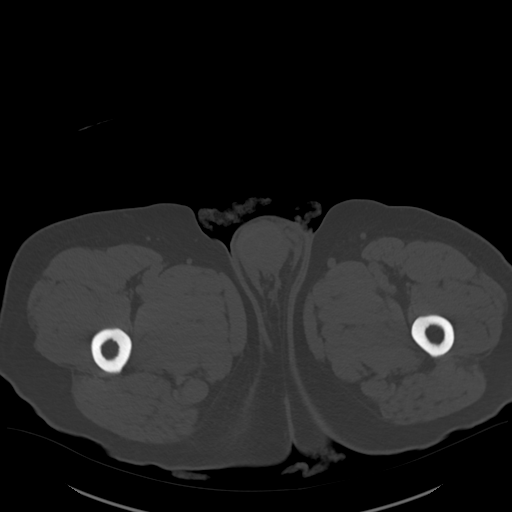
[im 19/110  soft-tissue]
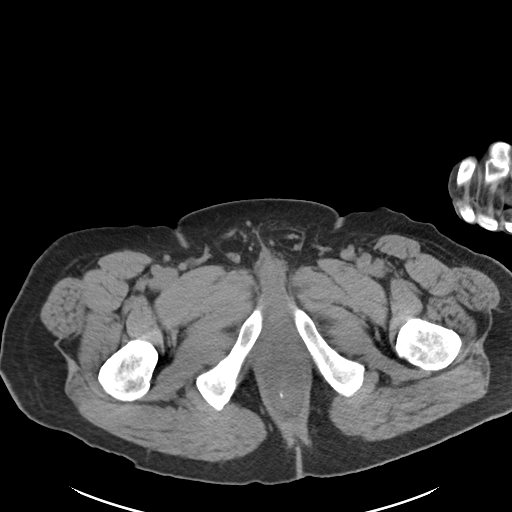
[im 25/110  soft-tissue]
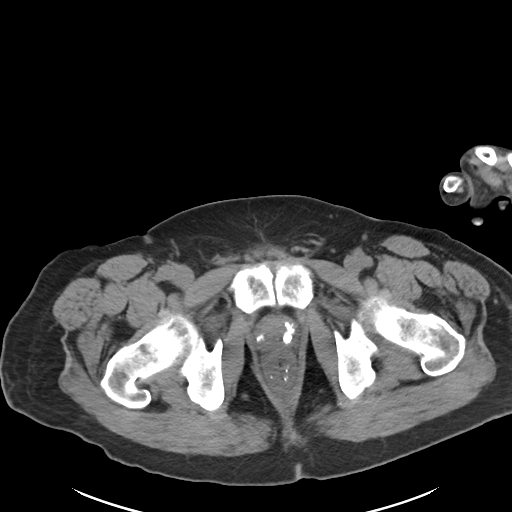
[im 31/110  soft-tissue]
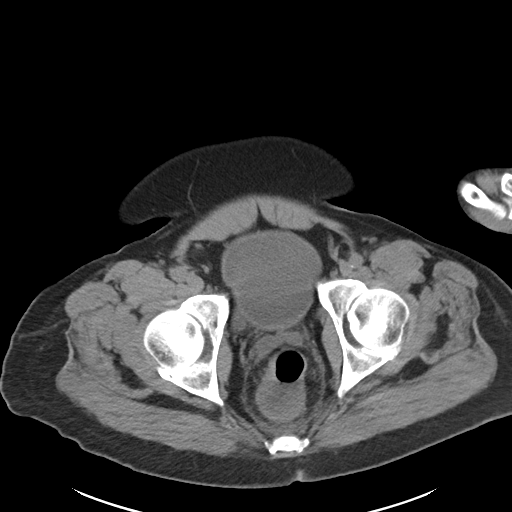
[im 43/110  soft-tissue]
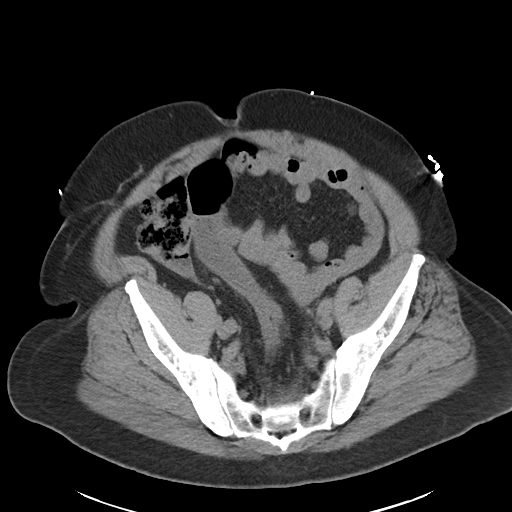
[im 49/110  soft-tissue]
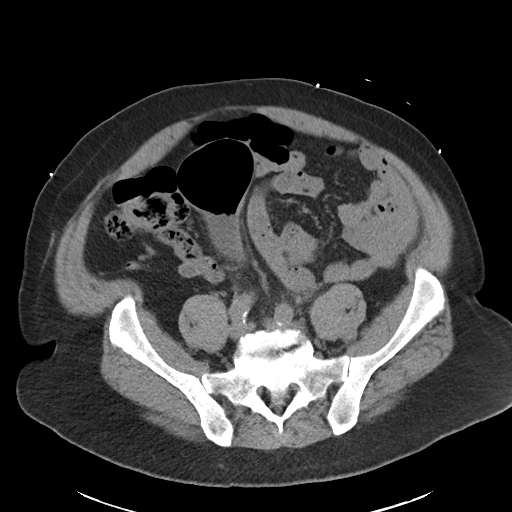
[im 61/110  soft-tissue]
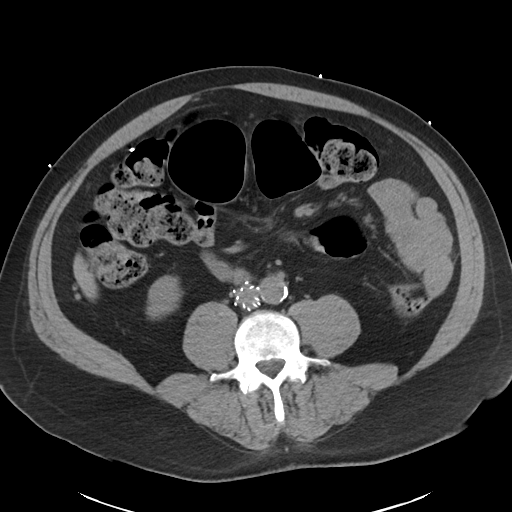
[im 67/110  soft-tissue]
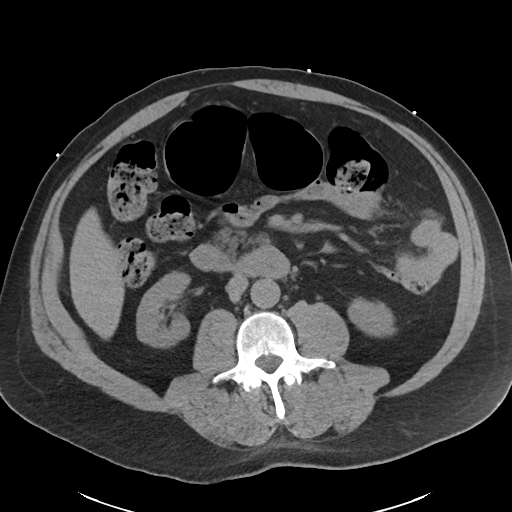
[im 79/110  soft-tissue]
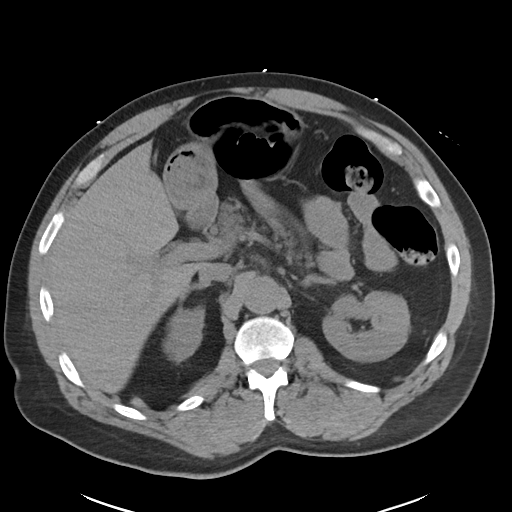
[im 79/110  bone]
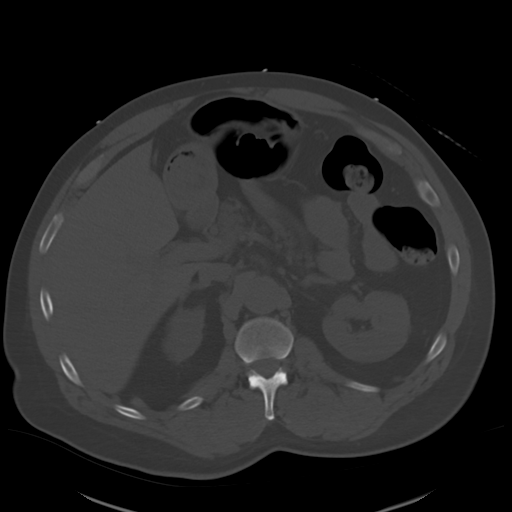
[im 85/110  soft-tissue]
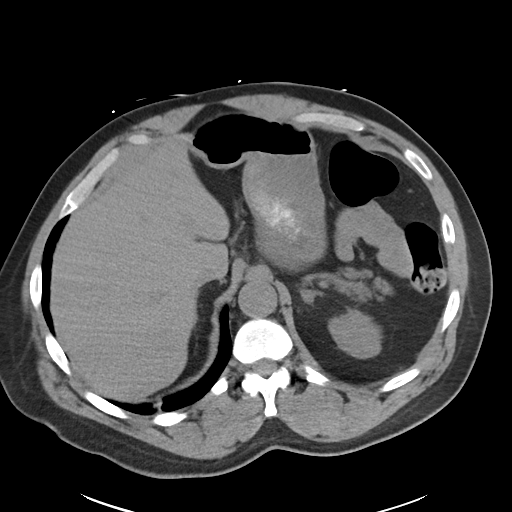
[im 91/110  soft-tissue]
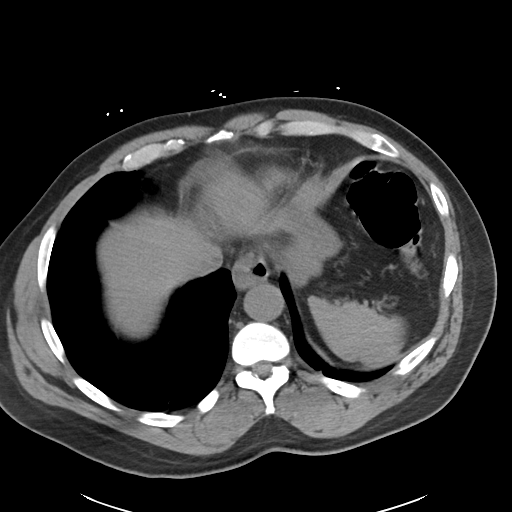
[im 103/110  soft-tissue]
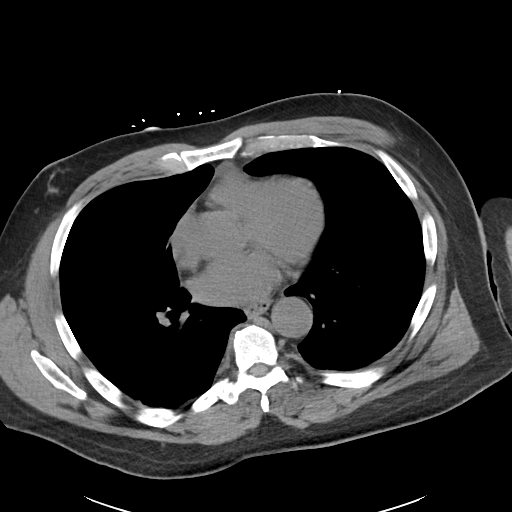

[Series 6: cor · coronal · 0.96mm/px · 3 of 120 slices shown]
[im 40/120  soft-tissue]
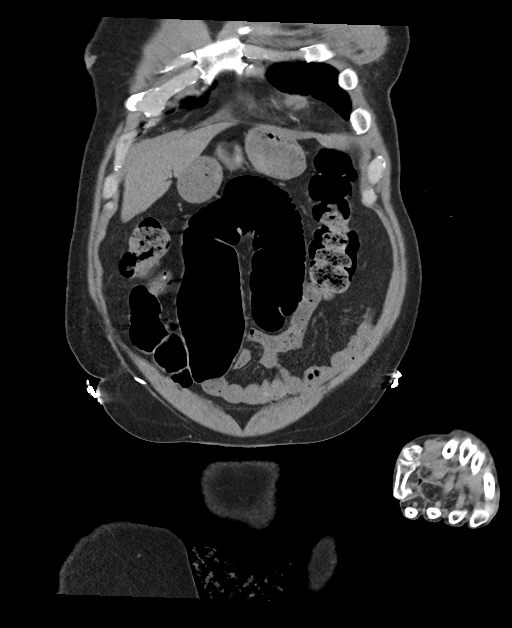
[im 53/120  soft-tissue]
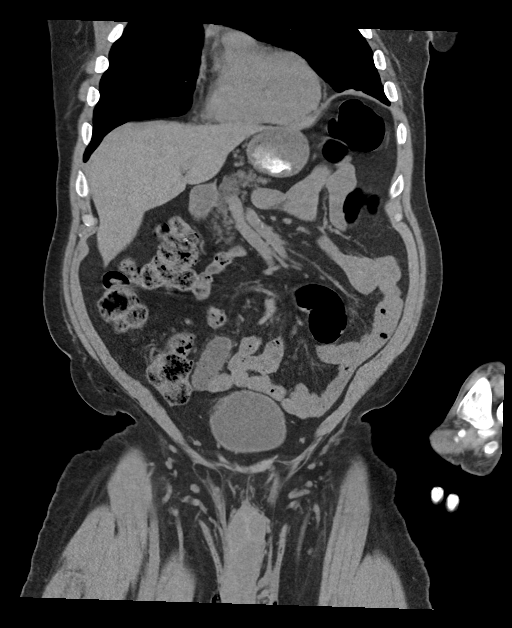
[im 67/120  soft-tissue]
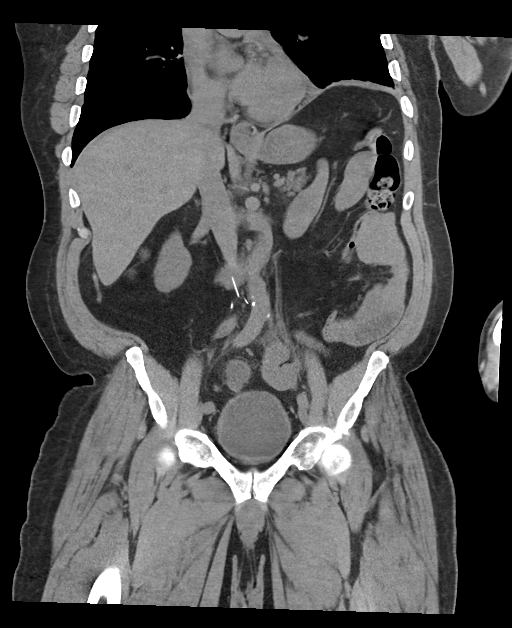

[15 of 46 positions shown; findings below may reference images not displayed]

FINDINGS: Lower chest: Small linear densities in the lower lung fields may
suggest scarring or subsegmental atelectasis. There is elevation of
left hemidiaphragm. Scattered coronary artery calcifications are
seen.

Hepatobiliary: No focal abnormality is seen in the liver. Surgical
clips are seen in gallbladder fossa.

Pancreas: No focal abnormality is seen.

Spleen: Unremarkable.

Adrenals/Urinary Tract: There is mild hyperplasia of left adrenal.
There is no hydronephrosis. There is 10 mm calculus in the mid to
lower portion of left kidney. There are 2 tiny calculi in the lower
pole of left kidney each measuring less than 3 mm. There are 2 small
calcifications in the lower pole of right kidney larger 1 measuring
5.5 mm. There are no calcific densities in the courses of the
ureters. There is a 5 mm calculus in the dependent portion of
urinary bladder. There is a bladder diverticulum in the right
posterolateral margin of the urinary bladder measuring approximally
2.6 cm. There is 11 mm calculus in the bladder diverticulum on the
right side. In image 78 of series 3, there is 14 mm smooth
marginated fluid density lesion in the left side of pelvis with no
demonstrable communication to the bladder. This could possibly
suggest a necrotic lymph node.

Stomach/Bowel: Small hiatal hernia is seen. Small bowel loops are
not dilated. Appendix is not dilated. There is no significant focal
wall thickening in the colon. There is gaseous distention of sigmoid
colon.

Vascular/Lymphatic: There are scattered arterial calcifications.
Inferior vena caval filter is seen.

Reproductive: Coarse calcifications are seen in the prostate.
Prostate is smaller than usual in size.

Other: There is no ascites or pneumoperitoneum. Small umbilical
hernia containing fat is seen. Small bilateral inguinal hernias
containing fat are noted, larger on the right side.

Musculoskeletal: Degenerative changes are noted in the lumbar spine
IMPRESSION: There is no hydronephrosis. Bilateral renal stones largest measuring
10 mm in the left kidney. There is 5 mm calculus in the dependent
portion of urinary bladder suggesting bladder calculus. There is 10
mm calculus within a diverticulum in the right posterolateral margin
of urinary bladder.

There is no evidence of intestinal obstruction or pneumoperitoneum.
Appendix is not dilated. Small hiatal hernia.

Other findings as described in the body of the report.

## 2023-04-30 ENCOUNTER — Other Ambulatory Visit: Payer: Self-pay

## 2023-04-30 ENCOUNTER — Encounter (HOSPITAL_COMMUNITY): Payer: Self-pay | Admitting: Emergency Medicine

## 2023-04-30 ENCOUNTER — Emergency Department (HOSPITAL_COMMUNITY): Payer: No Typology Code available for payment source

## 2023-04-30 ENCOUNTER — Emergency Department (HOSPITAL_COMMUNITY)
Admission: EM | Admit: 2023-04-30 | Discharge: 2023-04-30 | Disposition: A | Discharge status: Home / Self Care | Payer: No Typology Code available for payment source | Attending: Emergency Medicine | Admitting: Emergency Medicine

## 2023-04-30 DIAGNOSIS — Z7901 Long term (current) use of anticoagulants: Secondary | ICD-10-CM | POA: Insufficient documentation

## 2023-04-30 DIAGNOSIS — E1165 Type 2 diabetes mellitus with hyperglycemia: Secondary | ICD-10-CM | POA: Insufficient documentation

## 2023-04-30 DIAGNOSIS — Z794 Long term (current) use of insulin: Secondary | ICD-10-CM | POA: Insufficient documentation

## 2023-04-30 DIAGNOSIS — N3001 Acute cystitis with hematuria: Secondary | ICD-10-CM | POA: Diagnosis not present

## 2023-04-30 DIAGNOSIS — E876 Hypokalemia: Secondary | ICD-10-CM | POA: Insufficient documentation

## 2023-04-30 DIAGNOSIS — R739 Hyperglycemia, unspecified: Secondary | ICD-10-CM

## 2023-04-30 DIAGNOSIS — R319 Hematuria, unspecified: Secondary | ICD-10-CM | POA: Diagnosis present

## 2023-04-30 LAB — BASIC METABOLIC PANEL
Anion gap: 13 (ref 5–15)
Anion gap: 11 (ref 5–15)
BUN: 20 mg/dL (ref 8–23)
BUN: 16 mg/dL (ref 8–23)
CO2: 18 mmol/L — ABNORMAL LOW (ref 22–32)
CO2: 20 mmol/L — ABNORMAL LOW (ref 22–32)
Calcium: 8.8 mg/dL — ABNORMAL LOW (ref 8.9–10.3)
Calcium: 8.1 mg/dL — ABNORMAL LOW (ref 8.9–10.3)
Chloride: 100 mmol/L (ref 98–111)
Chloride: 106 mmol/L (ref 98–111)
Creatinine, Ser: 1.43 mg/dL — ABNORMAL HIGH (ref 0.61–1.24)
Creatinine, Ser: 1.16 mg/dL (ref 0.61–1.24)
GFR, Estimated: 55 mL/min — ABNORMAL LOW (ref >60)
GFR, Estimated: >60 mL/min (ref >60)
Glucose, Bld: 495 mg/dL — ABNORMAL HIGH (ref 70–99)
Glucose, Bld: 139 mg/dL — ABNORMAL HIGH (ref 70–99)
Potassium: 3.8 mmol/L (ref 3.5–5.1)
Potassium: 2.8 mmol/L — ABNORMAL LOW (ref 3.5–5.1)
Sodium: 131 mmol/L — ABNORMAL LOW (ref 135–145)
Sodium: 137 mmol/L (ref 135–145)

## 2023-04-30 LAB — CBC
HCT: 48.2 % (ref 39.0–52.0)
Hemoglobin: 16 g/dL (ref 13.0–17.0)
MCH: 29.5 pg (ref 26.0–34.0)
MCHC: 33.2 g/dL (ref 30.0–36.0)
MCV: 88.9 fL (ref 80.0–100.0)
Platelets: 197 10*3/uL (ref 150–400)
RBC: 5.42 MIL/uL (ref 4.22–5.81)
RDW: 13.2 % (ref 11.5–15.5)
WBC: 6 10*3/uL (ref 4.0–10.5)
nRBC: 0 % (ref 0.0–0.2)

## 2023-04-30 LAB — URINALYSIS, ROUTINE W REFLEX MICROSCOPIC
Bilirubin Urine: NEGATIVE
Glucose, UA: >=500 mg/dL — AB
Ketones, ur: NEGATIVE mg/dL
Leukocytes,Ua: NEGATIVE
Nitrite: NEGATIVE
Protein, ur: 30 mg/dL — AB
Specific Gravity, Urine: 1.011 (ref 1.005–1.030)
pH: 6 (ref 5.0–8.0)

## 2023-04-30 LAB — CBG MONITORING, ED
Glucose-Capillary: 463 mg/dL — ABNORMAL HIGH (ref 70–99)
Glucose-Capillary: 466 mg/dL — ABNORMAL HIGH (ref 70–99)
Glucose-Capillary: 427 mg/dL — ABNORMAL HIGH (ref 70–99)
Glucose-Capillary: 363 mg/dL — ABNORMAL HIGH (ref 70–99)
Glucose-Capillary: 228 mg/dL — ABNORMAL HIGH (ref 70–99)
Glucose-Capillary: 165 mg/dL — ABNORMAL HIGH (ref 70–99)

## 2023-04-30 LAB — TROPONIN I (HIGH SENSITIVITY)
Troponin I (High Sensitivity): 18 ng/L — ABNORMAL HIGH (ref <18)
Troponin I (High Sensitivity): 21 ng/L — ABNORMAL HIGH (ref <18)

## 2023-04-30 LAB — BRAIN NATRIURETIC PEPTIDE: B Natriuretic Peptide: 310.2 pg/mL — ABNORMAL HIGH (ref 0.0–100.0)

## 2023-04-30 MED ORDER — INSULIN REGULAR(HUMAN) IN NACL 100-0.9 UT/100ML-% IV SOLN
INTRAVENOUS | Status: DC
  Administered 2023-04-30: 10.5 [IU]/h via INTRAVENOUS
  Filled 2023-04-30: qty 100

## 2023-04-30 MED ORDER — SODIUM CHLORIDE 0.9 % IV BOLUS: 1000.0000 mL | Freq: Once | INTRAVENOUS | Status: DC

## 2023-04-30 MED ORDER — SODIUM CHLORIDE 0.9 % IV BOLUS
1000.0000 mL | Freq: Once | INTRAVENOUS | Status: AC
  Administered 2023-04-30: 1000 mL via INTRAVENOUS

## 2023-04-30 MED ORDER — POTASSIUM CHLORIDE CRYS ER 20 MEQ PO TBCR
40.0000 meq | EXTENDED_RELEASE_TABLET | Freq: Once | ORAL | Status: AC
  Administered 2023-04-30: 40 meq via ORAL
  Filled 2023-04-30: qty 2

## 2023-04-30 MED ORDER — DEXTROSE IN LACTATED RINGERS 5 % IV SOLN: INTRAVENOUS | Status: DC

## 2023-04-30 MED ORDER — INSULIN ASPART 100 UNIT/ML IJ SOLN
10.0000 [IU] | INTRAMUSCULAR | Status: AC
  Administered 2023-04-30: 10 [IU] via SUBCUTANEOUS

## 2023-04-30 MED ORDER — IOHEXOL 350 MG/ML SOLN
75.0000 mL | Freq: Once | INTRAVENOUS | Status: AC | PRN
  Administered 2023-04-30: 75 mL via INTRAVENOUS

## 2023-04-30 MED ORDER — DEXTROSE 50 % IV SOLN: 0.0000 mL | INTRAVENOUS | Status: DC | PRN

## 2023-04-30 MED ORDER — LACTATED RINGERS IV SOLN: INTRAVENOUS | Status: DC

## 2023-04-30 NOTE — ED Provider Notes (Signed)
 [place] [facility]GENC[facility] DEPARTMENT AT [facility] Provider Note   CSN: [id number] Arrival date & time: 04/30/23  1140     History  Chief Complaint  Patient presents with   Dizziness        Abnormal Lab    Eddie Conway is a 63 y.o. male.  HPI Pt. is here complaining of hyperglycemia.  Reports that he was seen at the [place] on Saturday for labs and was advised today he is hyperglycemic and hyponatremic.  Reports that he has been out of his insulin for 2 weeks because of issues with insurance.  Reports he also drinks a lot of sweet tea and puts a lot of sugar in his coffee.  Endorsing 3 pees.  Denies fevers at home.  Reports that he was at the [place] getting labs drawn because he has noticed some blood in his urine for the last 3 weeks associated with pain in the suprapubic area of his abdomen.  States the pain is only present when urinating.  Denies dysuria, flank pain.  Denies vomiting, diarrhea but is endorsing nausea.  States the pain is only present with urination but denies dysuria.     Home Medications Prior to Admission medications   Medication Sig Start Date End Date Taking? Authorizing Provider  acetaminophen (TYLENOL) 500 MG tablet Take 1,000 mg by mouth every 6 (six) hours as needed for moderate pain.    [provider]  apixaban (ELIQUIS) 5 MG TABS tablet Take 1 tablet (5 mg total) by mouth 2 (two) times daily. 05/25/22 06/24/22  Arrien, York Ram, MD  atorvastatin (LIPITOR) 40 MG tablet Take 40 mg by mouth daily.    [provider]  blood glucose meter kit and supplies KIT Dispense based on patient and insurance preference. Use up to four times daily as directed. 04/09/21   Briant Cedar, MD  cholecalciferol (VITAMIN D3) 25 MCG (1000 UNIT) tablet Take 1,000 Units by mouth daily.    [provider]  diltiazem (CARDIZEM CD) 180 MG 24 hr capsule Take 1 capsule (180 mg total) by mouth daily. 05/25/22 06/24/22  Arrien, York Ram, MD   insulin aspart (NOVOLOG) 100 UNIT/ML FlexPen Inject 6 Units into the skin 3 (three) times daily with meals. 05/25/22   Arrien, York Ram, MD  insulin glargine (LANTUS SOLOSTAR) 100 UNIT/ML Solostar Pen Inject 15 Units into the skin 2 (two) times daily. 05/25/22 06/24/22  Arrien, York Ram, MD  Insulin Pen Needle 32G X 4 MM MISC Use as directed with Lantus 05/25/22   Arrien, York Ram, MD  levETIRAcetam (KEPPRA) 750 MG tablet Take 750 mg by mouth 2 (two) times daily.    [provider]  topiramate (TOPAMAX) 100 MG tablet Take 100 mg by mouth in the morning, at noon, and at bedtime.    [provider]  vitamin B-12 (CYANOCOBALAMIN) 500 MCG tablet Take 500 mcg by mouth daily.    [provider]      Allergies    Patient has no known allergies.    Review of Systems   Review of Systems  Physical Exam Updated Vital Signs BP (!) 159/91   Pulse 84   Temp 98 F (36.7 C) (Oral)   Resp 15   Ht 6' (1.829 m)   Wt 101.2 kg   SpO2 100%   BMI 30.24 kg/m  Physical Exam Vitals and nursing note reviewed.  Constitutional:      General: He is not in acute distress.  Appearance: He is well-developed.  HENT:     Head: Normocephalic.   Eyes:     Conjunctiva/sclera: Conjunctivae normal.  Cardiovascular:     Rate and Rhythm: Normal rate and regular rhythm.  Pulmonary:     Effort: Pulmonary effort is normal. No respiratory distress.     Breath sounds: No stridor.  Abdominal:     General: There is no distension.  Skin:    General: Skin is warm and dry.  Neurological:     Mental Status: He is alert and oriented to person, place, and time.     ED Results / Procedures / Treatments   Labs (all labs ordered are listed, but only abnormal results are displayed) Labs Reviewed  BASIC METABOLIC PANEL - Abnormal; Notable for the following components:      Result Value   Sodium 131 (*)    CO2 18 (*)    Glucose, Bld 495 (*)    Creatinine, Ser 1.43 (*)     Calcium 8.8 (*)    GFR, Estimated 55 (*)    All other components within normal limits  URINALYSIS, ROUTINE W REFLEX MICROSCOPIC - Abnormal; Notable for the following components:   Color, Urine STRAW (*)    Glucose, UA >=500 (*)    Hgb urine dipstick LARGE (*)    Protein, ur 30 (*)    Bacteria, UA RARE (*)    All other components within normal limits  BRAIN NATRIURETIC PEPTIDE - Abnormal; Notable for the following components:   B Natriuretic Peptide 310.2 (*)    All other components within normal limits  BASIC METABOLIC PANEL - Abnormal; Notable for the following components:   Potassium 2.8 (*)    CO2 20 (*)    Glucose, Bld 139 (*)    Calcium 8.1 (*)    All other components within normal limits  CBG MONITORING, ED - Abnormal; Notable for the following components:   Glucose-Capillary 463 (*)    All other components within normal limits  CBG MONITORING, ED - Abnormal; Notable for the following components:   Glucose-Capillary 466 (*)    All other components within normal limits  CBG MONITORING, ED - Abnormal; Notable for the following components:   Glucose-Capillary 427 (*)    All other components within normal limits  CBG MONITORING, ED - Abnormal; Notable for the following components:   Glucose-Capillary 363 (*)    All other components within normal limits  CBG MONITORING, ED - Abnormal; Notable for the following components:   Glucose-Capillary 228 (*)    All other components within normal limits  CBG MONITORING, ED - Abnormal; Notable for the following components:   Glucose-Capillary 165 (*)    All other components within normal limits  TROPONIN I (HIGH SENSITIVITY) - Abnormal; Notable for the following components:   Troponin I (High Sensitivity) 18 (*)    All other components within normal limits  TROPONIN I (HIGH SENSITIVITY) - Abnormal; Notable for the following components:   Troponin I (High Sensitivity) 21 (*)    All other components within normal limits  CBC     EKG EKG Interpretation Date/Time:  Monday April 30 2023 14:41:26 EST Ventricular Rate:  106 PR Interval:    QRS Duration:  73 QT Interval:  362 QTC Calculation: 481 R Axis:   39  Text Interpretation: Atrial fibrillation Ventricular premature complex T wave abnormality Abnormal ECG Confirmed by Gerhard Munch [id number]) on 04/30/2023 4:55:04 PM  Radiology CT ABDOMEN PELVIS W CONTRAST Result Date: 04/30/2023  CLINICAL DATA:  Gross hematuria, suprapubic abdominal pain EXAM: CT ABDOMEN AND PELVIS WITH CONTRAST TECHNIQUE: Multidetector CT imaging of the abdomen and pelvis was performed using the standard protocol following bolus administration of intravenous contrast. RADIATION DOSE REDUCTION: This exam was performed according to the departmental dose-optimization program which includes automated exposure control, adjustment of the mA and/or kV according to patient size and/or use of iterative reconstruction technique. CONTRAST:  75mL OMNIPAQUE IOHEXOL 350 MG/ML SOLN COMPARISON:  04/08/2021 FINDINGS: Lower chest: Lung bases are clear. Hepatobiliary: Suspected scarring along the gallbladder fossa (coronal image 82). Liver is otherwise within normal limits. Status post cholecystectomy. No intrahepatic or extrahepatic ductal dilatation. Pancreas: Within normal limits. Spleen: Within normal limits Adrenals/Urinary Tract: Adrenal glands are within normal limits. 13 mm right upper pole renal cyst (series 8/image 12), benign (Bosniak I). No follow-up is recommended. Left kidney is within limits. No hydronephrosis. Mildly thick-walled bladder with a small right posterolateral bladder diverticulum containing a 13 mm bladder calculus. Additional small right anterior bladder diverticulum with surrounding mild perivesical stranding (series 3/image 69), correlate for cystitis. Stomach/Bowel: Stomach is notable for a tiny hiatal hernia. No evidence of bowel obstruction. Normal appendix (series 3/image 50). No  colonic wall thickening or inflammatory changes. Vascular/Lymphatic: No evidence of abdominal aortic aneurysm. Atherosclerotic calcifications of the abdominal aorta and branch vessels, although vessels remain patent. IVC filter. No suspicious abdominopelvic lymphadenopathy. Reproductive: Prostate is normal for dystrophic calcifications. Other: No abdominopelvic ascites. Musculoskeletal: Mild degenerative changes at L5-S1. IMPRESSION: Mildly thick-walled bladder with perivesical stranding along an anterior bladder diverticulum, suggesting cystitis. 13 mm bladder calculus within an additional right posterior bladder diverticulum. Electronically Signed   By: Charline Bills M.D.   On: 04/30/2023 19:41   DG Chest Portable 1 View Result Date: 04/30/2023 CLINICAL DATA:  Shortness of breath. Atrial fibrillation. Hyperglycemia and hyponatremia. Dizziness. EXAM: PORTABLE CHEST 1 VIEW COMPARISON:  05/23/2022 FINDINGS: Slightly shallow inspiration. Heart size and pulmonary vascularity are normal for technique. Lungs are clear. No pleural effusions. No pneumothorax. Mediastinal contours appear intact. Degenerative changes in the spine and shoulders. IMPRESSION: Shallow inspiration.  No evidence of active pulmonary disease. Electronically Signed   By: Burman Nieves M.D.   On: 04/30/2023 15:57    Procedures Procedures    Medications Ordered in ED Medications  lactated ringers infusion (0 mLs Intravenous Stopped 04/30/23 1820)  dextrose 5 % in lactated ringers infusion (0 mLs Intravenous Stopped 04/30/23 2028)  dextrose 50 % solution 0-50 mL (has no administration in time range)  sodium chloride 0.9 % bolus 1,000 mL (0 mLs Intravenous Stopped 04/30/23 1624)  insulin aspart (novoLOG) injection 10 Units (10 Units Subcutaneous Given 04/30/23 1445)  iohexol (OMNIPAQUE) 350 MG/ML injection 75 mL (75 mLs Intravenous Contrast Given 04/30/23 1747)  potassium chloride SA (KLOR-CON M) CR tablet 40 mEq (40 mEq Oral Given  04/30/23 2218)    ED Course/ Medical Decision Making/ A&P Clinical Course as of 04/30/23 2232  Mon Apr 30, 2023  1428 A fib, known history, just had cataract surgery. [place], 02/23/61. RVR. Regular heart rate pre-op. Cataract surgery had 2 of versed, fentanyl, 4zofran, few minutres into case patient became tachycardic. Tiny fluid bolus at surgical center, 5mg  cardizem. Is not anticoagulated.  [CG]  1429 Lorenso Courier [CG]    Clinical Course User Index [CG] Al Decant, [place]  Medical Decision Making Adult male with multiple medical issues including A-fib, insulin-dependent diabetes, now presents out of his insulin for several weeks with concern for hyperglycemia, electrolyte abnormalities.  Patient is awake, alert, speaking clearly, but found to have evidence for A-fib, but no distress. Broad differential including nonketotic hyperosmolar state, DKA, symptomatic A-fib, infection, and given his description of hematuria, intra-abdominal process or infection as well. Cardiac 90s, A-fib abnormal Pulse ox 98% room air normal   Amount and/or Complexity of Data Reviewed External Data Reviewed: notes. Labs: ordered. Decision-making details documented in ED Course. Radiology: ordered and independent interpretation performed. Decision-making details documented in ED Course. ECG/medicine tests: ordered and independent interpretation performed. Decision-making details documented in ED Course.  Risk Prescription drug management. Decision regarding hospitalization. Diagnosis or treatment significantly limited by social determinants of health.  Update: Patient in no distress 10:32 PM Labs have normalized, glucose is close to normal, troponin is essentially unchanged, he has no pain, no ongoing complaints, is accompanied by his wife at bedside.  We had a lengthy conversation about the presentation, his history, lack of medication access, likely playing  some role at least to today's presentation for hyperglycemia.  With no evidence for DKA, no evidence for hyperketotic state, and resolution of his hyperglycemia patient will transition from insulin and begin taking his home medications again.  Patient's repeat metabolic panel does have mild hypokalemia, this was repleted with oral supplementation.  CT scan with evidence for cystitis, without obvious infection, bacteremia, sepsis.  Patient was discussed this with his urologist tomorrow.  Patient comfortable with discharge, understands return precautions and follow-up instructions.        Final Clinical Impression(s) / ED Diagnoses Final diagnoses:  Hyperglycemia  Acute cystitis with hematuria     Gerhard Munch, MD 04/30/23 2234

## 2023-04-30 NOTE — ED Triage Notes (Signed)
 Pt seen at [facility] on Saturday for labs and called today reporting hyperglycemia and hyponatremia. Hx of T2DM. States he has been out of his insulin x 2 weeks. Pt also endorses dizziness and hematuria. States PCP is concerned about kidney function.

## 2023-04-30 NOTE — ED Provider Triage Note (Cosign Needed Addendum)
 Emergency Medicine Provider Triage Evaluation Note  Eddie Conway , a 63 y.o. pleasant male  was evaluated in triage.  He is here complaining of hyperglycemia.  Reports that he was seen at the [place] on Saturday for labs and was advised today he is hyperglycemic and hyponatremic.  Reports that he has been out of his insulin for 2 weeks because of issues with insurance.  Reports he also drinks a lot of sweet tea and puts a lot of sugar in his coffee.  Endorsing 3 pees.  Denies fevers at home.  Reports that he was at the [place] getting labs drawn because he has noticed some blood in his urine for the last 3 weeks associated with pain in the suprapubic area of his abdomen.  States the pain is only present when urinating.  Denies dysuria, flank pain.  Denies vomiting, diarrhea but is endorsing nausea.  States the pain is only present with urination but denies dysuria.  Review of Systems  Positive:  Negative:   Physical Exam  BP (!) 161/126   Pulse 67   Temp 97.6 F (36.4 C)   Resp 19   Ht 6' (1.829 m)   Wt 101.2 kg   SpO2 94%   BMI 30.24 kg/m  Gen:   Awake, no distress   Resp:  Normal effort  MSK:   Moves extremities without difficulty  Other:   Medical Decision Making  Medically screening exam initiated at 1:03 PM.  Appropriate orders placed.  Angelina Neece was informed that the remainder of the evaluation will be completed by another provider, this initial triage assessment does not replace that evaluation, and the importance of remaining in the ED until their evaluation is complete.     Addendum: Nurse tech showed EKG which showed A-fib RVR.  Patient was brought back from waiting room and placed in triage area until room can be provided for him.  He reports a history of A-fib.  States that he began having dizziness this morning described as the room floating when he stands up.  Denies any chest pain or shortness of breath.  Reports compliance on anticoagulation.  Denies history of  electrocardioversion.    Al Decant, PA-C 04/30/23 1324

## 2023-04-30 NOTE — Discharge Instructions (Signed)
 As discussed, today's evaluation was notable for hyperglycemia and acute cystitis.  Hyperglycemia has been treated, it is important that you resume taking your typical insulin regimen.  Additional evaluation is required for your cystitis or bloody urine.  Please speak with your care team tomorrow. Return here for concerning changes in your condition.
